# Patient Record
Sex: Male | Born: 1955 | Race: White | Hispanic: No | Marital: Married | Smoking: Current every day smoker
Health system: Southern US, Community
[De-identification: ages and names within clinical notes are randomized; demographics above are authoritative.]

## PROBLEM LIST (undated history)

## (undated) DIAGNOSIS — I214 Non-ST elevation (NSTEMI) myocardial infarction: Principal | ICD-10-CM

## (undated) DIAGNOSIS — Z8709 Personal history of other diseases of the respiratory system: Secondary | ICD-10-CM

## (undated) DIAGNOSIS — Z951 Presence of aortocoronary bypass graft: Secondary | ICD-10-CM

## (undated) DIAGNOSIS — K649 Unspecified hemorrhoids: Secondary | ICD-10-CM

## (undated) DIAGNOSIS — Q240 Dextrocardia: Secondary | ICD-10-CM

## (undated) DIAGNOSIS — I2510 Atherosclerotic heart disease of native coronary artery without angina pectoris: Secondary | ICD-10-CM

## (undated) DIAGNOSIS — Z87891 Personal history of nicotine dependence: Secondary | ICD-10-CM

## (undated) HISTORY — DX: Personal history of other diseases of the respiratory system: Z87.09

## (undated) HISTORY — PX: OTHER SURGICAL HISTORY: SHX169

## (undated) HISTORY — DX: Presence of aortocoronary bypass graft: Z95.1

## (undated) HISTORY — PX: LAMINECTOMY: SHX219

## (undated) HISTORY — DX: Atherosclerotic heart disease of native coronary artery without angina pectoris: I25.10

## (undated) HISTORY — DX: Personal history of nicotine dependence: Z87.891

## (undated) HISTORY — DX: Unspecified hemorrhoids: K64.9

## (undated) HISTORY — DX: Non-ST elevation (NSTEMI) myocardial infarction: I21.4

---

## 1975-12-31 HISTORY — PX: OTHER SURGICAL HISTORY: SHX169

## 2001-02-14 ENCOUNTER — Ambulatory Visit (HOSPITAL_COMMUNITY): Admission: RE | Admit: 2001-02-14 | Discharge: 2001-02-14 | Payer: Self-pay | Admitting: Orthopedic Surgery

## 2001-02-14 ENCOUNTER — Encounter: Payer: Self-pay | Admitting: Orthopedic Surgery

## 2003-04-14 ENCOUNTER — Emergency Department (HOSPITAL_COMMUNITY): Admission: EM | Admit: 2003-04-14 | Discharge: 2003-04-14 | Payer: Self-pay | Admitting: Emergency Medicine

## 2003-04-14 ENCOUNTER — Encounter: Payer: Self-pay | Admitting: Emergency Medicine

## 2003-04-21 ENCOUNTER — Encounter: Payer: Self-pay | Admitting: Internal Medicine

## 2003-04-21 ENCOUNTER — Encounter: Admission: RE | Admit: 2003-04-21 | Discharge: 2003-04-21 | Payer: Self-pay | Admitting: Internal Medicine

## 2003-04-21 ENCOUNTER — Ambulatory Visit (HOSPITAL_COMMUNITY): Admission: RE | Admit: 2003-04-21 | Discharge: 2003-04-21 | Payer: Self-pay | Admitting: Internal Medicine

## 2005-03-19 ENCOUNTER — Ambulatory Visit (HOSPITAL_BASED_OUTPATIENT_CLINIC_OR_DEPARTMENT_OTHER): Admission: RE | Admit: 2005-03-19 | Discharge: 2005-03-19 | Payer: Self-pay | Admitting: Orthopedic Surgery

## 2005-03-19 ENCOUNTER — Ambulatory Visit (HOSPITAL_COMMUNITY): Admission: RE | Admit: 2005-03-19 | Discharge: 2005-03-19 | Payer: Self-pay | Admitting: Orthopedic Surgery

## 2007-01-28 ENCOUNTER — Emergency Department (HOSPITAL_COMMUNITY): Admission: EM | Admit: 2007-01-28 | Discharge: 2007-01-28 | Payer: Self-pay | Admitting: Emergency Medicine

## 2011-01-10 ENCOUNTER — Emergency Department (HOSPITAL_COMMUNITY)
Admission: EM | Admit: 2011-01-10 | Discharge: 2011-01-10 | Payer: Self-pay | Source: Home / Self Care | Admitting: Family Medicine

## 2011-05-14 ENCOUNTER — Inpatient Hospital Stay (INDEPENDENT_AMBULATORY_CARE_PROVIDER_SITE_OTHER)
Admission: RE | Admit: 2011-05-14 | Discharge: 2011-05-14 | Disposition: A | Discharge status: Home / Self Care | Payer: 59 | Source: Ambulatory Visit | Attending: Family Medicine | Admitting: Family Medicine

## 2011-05-14 DIAGNOSIS — M799 Soft tissue disorder, unspecified: Secondary | ICD-10-CM

## 2011-05-17 NOTE — Op Note (Signed)
NAMELAVANCE, BEAZER             ACCOUNT NO.:  [id number]   MEDICAL RECORD NO.:  [id number]          PATIENT TYPE:  AMB   LOCATION:  DSC                          FACILITY:  [facility]   PHYSICIAN:  Katy Fitch. 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   POSTOPERATIVE DIAGNOSIS:  Chronic entrapment neuropathy right median nerve  at carpal tunnel.   OPERATION:  Release of right transcarpal ligament.   OPERATING SURGEON:  Katy Fitch. Sypher, M.D.   ASSISTANT:  Marveen Reeks. Dasnoit, P.A.C.   ANESTHESIA:  General by LMA.   SUPERVISING ANESTHESIOLOGIST:  Dr. Gypsy Balsam.   INDICATIONS:  Izaah Westman is a 55 year old gentleman referred for  evaluation and management of a painful and numb right hand. Clinical  examination revealed signs of probable clinical carpal tunnel syndrome. Dr.  Johna Roles performed detailed electrodiagnostic studies December 13, 2005,  documenting profound right median neuropathy with a motor latency of greater  than 13 and sensory latency of greater than six.  Due to failure to respond  to nonoperative measures, he is brought to the operating room at this time  for release of his right transcarpal ligament.   PROCEDURE:  Burwell Bethel is brought to the operating room and placed in  supine position on the operating table.   Following induction general anesthesia by LMA technique under direct  supervision of Dr. Gypsy Balsam, the right arm was prepped with Betadine soap and  solution and sterilely draped. A pneumatic tourniquet was applied to the  proximal brachium.   Following exsanguination of the limb, an arterial tourniquet on the proximal  brachium was inflated to 220 mmHg.   Procedure commenced with a short incision in the line of the ring finger and  the palm. Subcutaneous  tissues were carefully divided revealing the palmar  fascia. This was split longitudinally to reveal the common sensory branch of  the median nerve. These were followed back to transverse carpal ligament  which was gently isolated from the median nerve utilizing a Child psychotherapist.   The ligament was released along its ulnar border with scissors extending  into this distal forearm.   This widely opened carpal canal.   Mr. Eaves had minimum tenosynovium within the canal. He had simply a  confined canal.   No masses or other predicaments were appreciated.   The wound was then repaired with intradermal 3-0 Prolene suture. A  compressive dressing was applied with a volar plaster splint maintaining the  wrist in 5 degrees dorsiflexion.      RVS/MEDQ  D:  03/19/2005  T:  03/19/2005  Job:  [id number]

## 2012-01-03 ENCOUNTER — Ambulatory Visit (INDEPENDENT_AMBULATORY_CARE_PROVIDER_SITE_OTHER): Payer: 59

## 2012-01-03 DIAGNOSIS — R05 Cough: Secondary | ICD-10-CM

## 2012-01-23 ENCOUNTER — Ambulatory Visit (INDEPENDENT_AMBULATORY_CARE_PROVIDER_SITE_OTHER): Payer: 59

## 2012-01-23 DIAGNOSIS — R05 Cough: Secondary | ICD-10-CM

## 2012-01-23 DIAGNOSIS — J45909 Unspecified asthma, uncomplicated: Secondary | ICD-10-CM

## 2012-02-08 ENCOUNTER — Other Ambulatory Visit: Payer: Self-pay | Admitting: Physician Assistant

## 2012-02-11 ENCOUNTER — Other Ambulatory Visit: Payer: Self-pay | Admitting: Physician Assistant

## 2012-02-17 ENCOUNTER — Other Ambulatory Visit: Payer: Self-pay | Admitting: Physician Assistant

## 2012-03-19 ENCOUNTER — Other Ambulatory Visit: Payer: Self-pay | Admitting: Physician Assistant

## 2012-03-21 ENCOUNTER — Other Ambulatory Visit: Payer: Self-pay | Admitting: Physician Assistant

## 2012-03-27 ENCOUNTER — Ambulatory Visit (INDEPENDENT_AMBULATORY_CARE_PROVIDER_SITE_OTHER): Payer: 59 | Admitting: Internal Medicine

## 2012-03-27 VITALS — BP 136/91 | HR 88 | Temp 97.9°F | Resp 16 | Ht 71.0 in | Wt 215.0 lb

## 2012-03-27 DIAGNOSIS — J019 Acute sinusitis, unspecified: Secondary | ICD-10-CM

## 2012-03-27 DIAGNOSIS — G4700 Insomnia, unspecified: Secondary | ICD-10-CM | POA: Insufficient documentation

## 2012-03-27 DIAGNOSIS — J209 Acute bronchitis, unspecified: Secondary | ICD-10-CM

## 2012-03-27 DIAGNOSIS — F17200 Nicotine dependence, unspecified, uncomplicated: Secondary | ICD-10-CM

## 2012-03-27 MED ORDER — ZOLPIDEM TARTRATE 5 MG PO TABS: 5.0000 mg | ORAL_TABLET | ORAL | Status: DC | PRN

## 2012-03-27 MED ORDER — BECLOMETHASONE DIPROPIONATE 80 MCG/ACT IN AERS: 1.0000 | INHALATION_SPRAY | RESPIRATORY_TRACT | Status: DC | PRN

## 2012-03-27 MED ORDER — VARENICLINE TARTRATE 0.5 MG PO TABS: 0.5000 mg | ORAL_TABLET | Freq: Two times a day (BID) | ORAL | Status: AC

## 2012-03-27 MED ORDER — AZITHROMYCIN 250 MG PO TABS: ORAL_TABLET | ORAL | Status: AC

## 2012-03-27 NOTE — Progress Notes (Signed)
  Subjective:    Patient ID: Jason Roberts, male    DOB: [birth year], 56 y.o.   MRN: [id number]  Sinus Problem This is a new problem. The current episode started 1 to 4 weeks ago. The problem is unchanged. There has been no fever. Associated symptoms include congestion, coughing and a hoarse voice. Pertinent negatives include no shortness of breath.  Jason Roberts is here for a history of three weeks of increased sinus and chest congestion.  He continues to smoke.  No fever or chest pain.  He does not feel as though he is wheezing but states the inhaler given last time seemed to help a lot along with the prednisone.  He is working okay.  Is considering switching to an electric cigarette.  Knows he needs to quit smoking    Review of Systems  HENT: Positive for congestion and hoarse voice.   Respiratory: Positive for cough. Negative for chest tightness, shortness of breath and wheezing.   All other systems reviewed and are negative.       Objective:   Physical Exam  Vitals reviewed. Constitutional: He is oriented to person, place, and time. He appears well-developed and well-nourished.  HENT:  Head: Normocephalic.  Right Ear: External ear normal.  Left Ear: External ear normal.  Mouth/Throat: No oropharyngeal exudate.  Neck: Neck supple.  Pulmonary/Chest: Effort normal.       Rare wheeze heart in left front chest wall.  Abdominal: Soft.  Lymphadenopathy:    He has no cervical adenopathy.  Neurological: He is alert and oriented to person, place, and time.  Skin: Skin is warm and dry.  Psychiatric: He has a normal mood and affect. His behavior is normal.          Assessment & Plan:  Bronchitis/Sinusitis:  Zpack, Qvar for cough.   Nicotine Addiction:  RF CHantix prescription in case he wishes to try smoking cessation with Chantix as he has no risk factors.  Emphasized the need to work on stopping for good! Insomnia:  Ambien refilled but not sent to pharmacy so we refilled today  with 2 refills per Jason Roberts.

## 2012-03-27 NOTE — Patient Instructions (Signed)
Take your antibiotic and use your inhaler for your cough and sinus congestion.  You should improve in 5 days, return sooner if your symptoms worsen.  Chronic Obstructive Pulmonary Disease Exacerbation Chronic obstructive pulmonary disease (COPD) is a condition that limits airflow. COPD may include chronic bronchitis, pulmonary emphysema, or both. A COPD exacerbation means that your COPD has gotten worse. Without treatment, this can be a life-threatening problem. COPD exacerbation requires immediate medical care. CAUSES  COPD exacerbation can be caused by:  Exposure to smoke.   Exposure to air pollution, chemical fumes, or dust.   Respiratory infections.   Genetics, particularly alpha 1-antitrypsin deficiency.   A condition in which the body's immune system attacks itself (autoimmunity).  SYMPTOMS   Increased coughing.   Increased wheezing.   Increased shortness of breath.   Swelling due to a buildup of fluid (peripheral edema) related to heart strain.   Rapid breathing.   Chest enlargement (barrel chest).   Chest tightness.  DIAGNOSIS  There is no single test that can diagnosis COPD exacerbation. Your history, physical exam, and other tests will help your caregiver make a diagnosis. Tests may include a chest X-ray, pulmonary function tests, spirometry, basic lab tests, and an arterial blood gas test. TREATMENT  Severe problems may require a stay in the hospital. Depending on the cause of your problems, the following may be prescribed:  Antibiotic medicines.   Bronchodilators (inhaled or tablets).   Cortisone medicines (inhaled or tablets).   Supplemental oxygen therapy.   Pulmonary rehabilitation. This is a broad program that may involve exercise, nutrition counseling, breathing techniques, and further education about your condition.  It is important to use good technique with inhaled medicines. Spacer devices may be needed to help improve drug delivery. HOME CARE  INSTRUCTIONS   Do not smoke. Quitting smoking is very important to prevent worsening of COPD.   Avoid exposure to all substances that irritate the airway, especially tobacco smoke.   If prescribed, take your antibiotics as directed. Finish them even if you start to feel better.   Only take over-the-counter or prescription medicines as directed by your caregiver.   Drink enough fluids to keep your urine clear or pale yellow. This can help thin bronchial secretions.   Use a cool mist vaporizer. This makes it easier to clear your chest when you cough.   If you have a home nebulizer and oxygen, continue to use them as directed.   Maintain all necessary vaccinations to prevent infections.   Exercise regularly.   Eat a healthy diet.   Keep all follow-up appointments as directed by your caregiver.  SEEK IMMEDIATE MEDICAL CARE IF:  You have extreme shortness of breath.   You have severe chest pain or blood in your sputum.   You have a high fever, weakness, repeated vomiting, or fainting.   You feel confused.  MAKE SURE YOU:   Understand these instructions.   Will watch your condition.   Will get help right away if you are not doing well or get worse.  Document Released: 10/13/2007 Document Revised: 12/05/2011 Document Reviewed: 08/13/2011 [facility] Patient Information 2012 [place], [place].

## 2012-05-29 ENCOUNTER — Telehealth: Payer: Self-pay

## 2012-05-29 NOTE — Telephone Encounter (Signed)
Pt wife calling stating that the ppw that pt needs to be filled out for work is for fmla because he has hemmorriods and the bleed a lot and he has to leave work and come home,. And she also would like referral for specialist [phone].

## 2012-05-30 DIAGNOSIS — Z0271 Encounter for disability determination: Secondary | ICD-10-CM

## 2012-06-24 ENCOUNTER — Other Ambulatory Visit: Payer: Self-pay | Admitting: Physician Assistant

## 2012-06-27 ENCOUNTER — Ambulatory Visit (INDEPENDENT_AMBULATORY_CARE_PROVIDER_SITE_OTHER): Payer: 59 | Admitting: Family Medicine

## 2012-06-27 VITALS — BP 129/85 | HR 76 | Temp 98.0°F | Resp 18 | Ht 70.0 in | Wt 215.0 lb

## 2012-06-27 DIAGNOSIS — M1712 Unilateral primary osteoarthritis, left knee: Secondary | ICD-10-CM

## 2012-06-27 DIAGNOSIS — K602 Anal fissure, unspecified: Secondary | ICD-10-CM

## 2012-06-27 DIAGNOSIS — M1710 Unilateral primary osteoarthritis, unspecified knee: Secondary | ICD-10-CM

## 2012-06-27 DIAGNOSIS — J329 Chronic sinusitis, unspecified: Secondary | ICD-10-CM

## 2012-06-27 MED ORDER — HYDROCODONE-ACETAMINOPHEN 5-500 MG PO TABS: 1.0000 | ORAL_TABLET | Freq: Three times a day (TID) | ORAL | Status: AC | PRN

## 2012-06-27 MED ORDER — HYDROCORTISONE 2.5 % RE CREA: TOPICAL_CREAM | Freq: Two times a day (BID) | RECTAL | Status: AC

## 2012-06-27 MED ORDER — PREDNISONE 20 MG PO TABS: ORAL_TABLET | ORAL | Status: DC

## 2012-06-27 MED ORDER — LEVOFLOXACIN 500 MG PO TABS: 500.0000 mg | ORAL_TABLET | Freq: Every day | ORAL | Status: AC

## 2012-06-27 NOTE — Progress Notes (Signed)
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  10 years ago he "blew out" the left knee.  Intermittently he has problems with the knee, which again flared this week.  Also, he complains about sinus congestion.  This is a recurrent problem and this has been fluctuating for a month.  Has had intermittent epistaxis.  Smokes  Also notes painful anus with some bleeding.  Had colonoscopy 2012  Objective:  Clearly favoring the right leg HEENT:  White patches in nasal passages with mucopurulent d/c Chest:  Left thoracotomy scar Left knee:  Nontender, slow ROM with no lig laxity Rectal:  Small fissure, skin tags   Assessment:   1. Sinusitis with white patches 2. Knee arthritis 3. Anal fissure  Plan: 1. Sinusitis  levofloxacin (LEVAQUIN) 500 MG tablet, Ambulatory referral to ENT  2. Arthritis of knee, left  predniSONE (DELTASONE) 20 MG tablet, HYDROcodone-acetaminophen (VICODIN) 5-500 MG per tablet  3. Anal fissure  hydrocortisone (ANUSOL-HC) 2.5 % rectal cream   FMLA papers filled Encouraged to quit smoking

## 2012-08-06 ENCOUNTER — Telehealth: Payer: Self-pay

## 2012-08-06 MED ORDER — ZOLPIDEM TARTRATE 5 MG PO TABS: 5.0000 mg | ORAL_TABLET | Freq: Every evening | ORAL | Status: DC | PRN

## 2012-08-06 NOTE — Telephone Encounter (Signed)
Spoke with wife and they need refill on Ambien. Please call [phone]

## 2012-08-06 NOTE — Telephone Encounter (Signed)
Pt wife notified that her and husbands rx was sent into pharmacy

## 2012-08-06 NOTE — Telephone Encounter (Signed)
At T[facility] desk 

## 2012-09-08 ENCOUNTER — Other Ambulatory Visit: Payer: Self-pay | Admitting: Physician Assistant

## 2012-09-09 ENCOUNTER — Other Ambulatory Visit: Payer: Self-pay | Admitting: *Deleted

## 2012-09-10 ENCOUNTER — Other Ambulatory Visit: Payer: Self-pay | Admitting: Physician Assistant

## 2012-09-15 ENCOUNTER — Other Ambulatory Visit: Payer: Self-pay | Admitting: Physician Assistant

## 2013-02-01 ENCOUNTER — Encounter (INDEPENDENT_AMBULATORY_CARE_PROVIDER_SITE_OTHER): Payer: Self-pay | Admitting: General Surgery

## 2013-02-04 ENCOUNTER — Ambulatory Visit (INDEPENDENT_AMBULATORY_CARE_PROVIDER_SITE_OTHER): Payer: 59 | Admitting: General Surgery

## 2013-02-11 ENCOUNTER — Ambulatory Visit (INDEPENDENT_AMBULATORY_CARE_PROVIDER_SITE_OTHER): Payer: 59 | Admitting: General Surgery

## 2013-02-22 ENCOUNTER — Other Ambulatory Visit: Payer: Self-pay | Admitting: Family Medicine

## 2013-02-22 ENCOUNTER — Ambulatory Visit
Admission: RE | Admit: 2013-02-22 | Discharge: 2013-02-22 | Disposition: A | Discharge status: Home / Self Care | Payer: 59 | Source: Ambulatory Visit | Attending: Family Medicine | Admitting: Family Medicine

## 2013-02-22 DIAGNOSIS — R05 Cough: Secondary | ICD-10-CM

## 2013-02-25 ENCOUNTER — Encounter (INDEPENDENT_AMBULATORY_CARE_PROVIDER_SITE_OTHER): Payer: Self-pay | Admitting: General Surgery

## 2013-02-25 ENCOUNTER — Ambulatory Visit (INDEPENDENT_AMBULATORY_CARE_PROVIDER_SITE_OTHER): Payer: 59 | Admitting: General Surgery

## 2013-02-25 VITALS — BP 124/83 | HR 72 | Temp 98.6°F | Resp 16 | Ht 70.0 in | Wt 231.8 lb

## 2013-02-25 DIAGNOSIS — K649 Unspecified hemorrhoids: Secondary | ICD-10-CM

## 2013-02-25 MED ORDER — HYDROCORTISONE ACETATE 25 MG RE SUPP: 25.0000 mg | Freq: Two times a day (BID) | RECTAL | Status: DC

## 2013-02-25 NOTE — Progress Notes (Signed)
Subjective:     Patient ID: Jason Roberts, male   DOB: Aug 18, 1956, 57 y.o.   MRN: [id number]  HPI The patient is a 57 year old male Referred by Dr. Blair Heys  And states he's had a long history of hemorrhoids. The patient presents today with chief complaint of bleeding while at work. The patient works as Pension scheme manager and states that after having a lift help him to the frequency of bleeding has decreased.  The patient states he is a regular bowel movement in the a.m. With some bleeding at that time.  Review of Systems  Constitutional: Negative.   HENT: Negative.   Eyes: Negative.   Respiratory: Negative.   Cardiovascular: Negative.   Gastrointestinal: Negative.   Endocrine: Negative.   Neurological: Negative.        Objective:   Physical Exam  Constitutional: He is oriented to person, place, and time. He appears well-developed and well-nourished.  HENT:  Head: Normocephalic and atraumatic.  Eyes: EOM are normal. Pupils are equal, round, and reactive to light.  Neck: Normal range of motion. Neck supple.  Cardiovascular: Normal rate and normal heart sounds.   Pulmonary/Chest: Effort normal and breath sounds normal.  Abdominal: Bowel sounds are normal.  Genitourinary:     Musculoskeletal: Normal range of motion.  Neurological: He is alert and oriented to person, place, and time.    Anoscopy: small internal hemorrhoids and external hemorrhoids, nonthrombosed     Assessment:     57 year old male with external and internal hemorrhoids     Plan:     We discussed and the patient having regular bowel regimen to ensure male had bowel movements easily and smoothly without straining. We discussed that the need for assistance at work to help prevent any strain to decrease  The frequency of bleeding would help. 1. Anusol suppositories 2. Patient follow up in 4-6 weeks to reevaluate the situation. At that time the patient like to proceed with surgical intervention  we'll set him up for that.

## 2013-03-20 ENCOUNTER — Encounter (HOSPITAL_BASED_OUTPATIENT_CLINIC_OR_DEPARTMENT_OTHER): Payer: Self-pay | Admitting: *Deleted

## 2013-03-20 ENCOUNTER — Emergency Department (HOSPITAL_BASED_OUTPATIENT_CLINIC_OR_DEPARTMENT_OTHER)
Admission: EM | Admit: 2013-03-20 | Discharge: 2013-03-20 | Disposition: A | Discharge status: Home / Self Care | Payer: 59 | Attending: Emergency Medicine | Admitting: Emergency Medicine

## 2013-03-20 DIAGNOSIS — J329 Chronic sinusitis, unspecified: Secondary | ICD-10-CM

## 2013-03-20 DIAGNOSIS — Z79899 Other long term (current) drug therapy: Secondary | ICD-10-CM | POA: Insufficient documentation

## 2013-03-20 DIAGNOSIS — F17200 Nicotine dependence, unspecified, uncomplicated: Secondary | ICD-10-CM | POA: Insufficient documentation

## 2013-03-20 DIAGNOSIS — R51 Headache: Secondary | ICD-10-CM | POA: Insufficient documentation

## 2013-03-20 DIAGNOSIS — R040 Epistaxis: Secondary | ICD-10-CM | POA: Insufficient documentation

## 2013-03-20 DIAGNOSIS — Z8679 Personal history of other diseases of the circulatory system: Secondary | ICD-10-CM | POA: Insufficient documentation

## 2013-03-20 DIAGNOSIS — IMO0002 Reserved for concepts with insufficient information to code with codable children: Secondary | ICD-10-CM | POA: Insufficient documentation

## 2013-03-20 DIAGNOSIS — J3489 Other specified disorders of nose and nasal sinuses: Secondary | ICD-10-CM | POA: Insufficient documentation

## 2013-03-20 MED ORDER — OXYMETAZOLINE HCL 0.05 % NA SOLN: 2.0000 | Freq: Once | NASAL | Status: DC

## 2013-03-20 MED ORDER — OXYMETAZOLINE HCL 0.05 % NA SOLN
1.0000 | Freq: Once | NASAL | Status: AC
  Administered 2013-03-20: 1 via NASAL
  Filled 2013-03-20: qty 15

## 2013-03-20 MED ORDER — CEFDINIR 300 MG PO CAPS: 300.0000 mg | ORAL_CAPSULE | Freq: Two times a day (BID) | ORAL | Status: DC

## 2013-03-20 MED ORDER — IBUPROFEN 800 MG PO TABS
800.0000 mg | ORAL_TABLET | Freq: Once | ORAL | Status: AC
  Administered 2013-03-20: 800 mg via ORAL
  Filled 2013-03-20: qty 1

## 2013-03-20 NOTE — ED Provider Notes (Signed)
History     CSN: [id number]  Arrival date & time 03/20/13  4098   First MD Initiated Contact with Patient 03/20/13 1946      Chief Complaint  Patient presents with  . Epistaxis    (Consider location/radiation/quality/duration/timing/severity/associated sxs/prior treatment) HPI Comments: Pt presents to the ED for intermittent epistaxis over the past 3 days.  States it was initially only out of the left nostril but past few episodes have been from both nostrils.  Blood pours out profusely but hemostasis has been achieved with direct pressure each time.  Pt is not currently on any blood thinners.  Also complaining of sinus congestion, pressure, and headache.  Prior sinus infections with similar sx.  Denies any chest pain, SOB, nausea, vomiting, dizziness, or lightheadedness.  Patient is a 57 y.o. male presenting with nosebleeds. The history is provided by the patient.  Epistaxis     Past Medical History  Diagnosis Date  . Hemorrhoids     Past Surgical History  Procedure Laterality Date  . Lll lung  1977    due to empyema  . Laminectomy    . Left arm chain saw injury      Family History  Problem Relation Age of Onset  . Diabetes Mother   . Hypertension Mother   . Stroke Father     History  Substance Use Topics  . Smoking status: Current Every Day Smoker -- 1.00 packs/day    Types: Cigarettes  . Smokeless tobacco: Not on file  . Alcohol Use: No      Review of Systems  HENT: Positive for nosebleeds and sinus pressure.   All other systems reviewed and are negative.    Allergies  Penicillins  Home Medications   Current Outpatient Rx  Name  Route  Sig  Dispense  Refill  . beclomethasone (QVAR) 80 MCG/ACT inhaler   Inhalation   Inhale 1 puff into the lungs as needed.   1 Inhaler   2   . doxycycline (VIBRAMYCIN) 100 MG capsule               . HYDROcodone-homatropine (HYCODAN) 5-1.5 MG/5ML syrup               . hydrocortisone (ANUSOL-HC) 25 MG  suppository   Rectal   Place 1 suppository (25 mg total) rectally 2 (two) times daily.   12 suppository   0   . levothyroxine (SYNTHROID, LEVOTHROID) 50 MCG tablet               . predniSONE (DELTASONE) 20 MG tablet      3-2-2-1-1-1 daily with food   13 tablet   0   . traZODone (DESYREL) 50 MG tablet   Oral   Take 50 mg by mouth at bedtime.         Marland Kitchen zolpidem (AMBIEN) 5 MG tablet      TAKE ONE TABLET BY MOUTH AT BEDTIME AS NEEDED FOR SLEEP   30 tablet   0     BP 130/99  Pulse 81  Temp(Src) 98.4 F (36.9 C) (Oral)  Resp 18  Ht 5\' 10"  (1.778 m)  Wt 215 lb (97.523 kg)  BMI 30.85 kg/m2  SpO2 99%  Physical Exam  Nursing note and vitals reviewed. Constitutional: He is oriented to person, place, and time. He appears well-developed and well-nourished.  HENT:  Head: Normocephalic and atraumatic.  Right Ear: Ear canal normal. A middle ear effusion is present.  Left Ear: Ear canal normal. A  middle ear effusion is present.  Nose: Epistaxis (both nostrils) is observed. Right sinus exhibits maxillary sinus tenderness and frontal sinus tenderness. Left sinus exhibits maxillary sinus tenderness and frontal sinus tenderness.  Mouth/Throat: Uvula is midline, oropharynx is clear and moist and mucous membranes are normal. No oropharyngeal exudate, posterior oropharyngeal edema, posterior oropharyngeal erythema or tonsillar abscesses.  No blood noted in posterior pharynx  Eyes: Conjunctivae and EOM are normal. Pupils are equal, round, and reactive to light.  Neck: Normal range of motion. Neck supple.  Cardiovascular: Normal rate, regular rhythm and normal heart sounds.   Pulmonary/Chest: Effort normal and breath sounds normal. He has no wheezes.  Abdominal: Soft. Bowel sounds are normal.  Lymphadenopathy:    He has no cervical adenopathy.  Neurological: He is alert and oriented to person, place, and time.  Skin: Skin is warm and dry.  Psychiatric: He has a normal mood and  affect.    ED Course  Procedures (including critical care time)  Labs Reviewed - No data to display No results found.   1. Epistaxis   2. Sinus infection       MDM   Intermittent epistaxis x 3 days.  Bleeding site visualized on exam.  Bleeding well controlled in the ED following pt blowing his nose fully and afrin.  Pt feels like he is getting a sinus infection- admits to sinus pressure with headache.  Will tx with omnicef x 10days.  Instructed on afrin use at home should nosebleed recur.  FU with PCP within the next week for recheck or sooner if sx are not resolving.  Return precautions advised.        Garlon Hatchet, PA-C 03/21/13 1700

## 2013-03-20 NOTE — ED Notes (Addendum)
Pt states he has had nosebleeds off and on x 3 days. Some dizziness earlier today. States he feels like he has a sinus infection. No bleeding at present.

## 2013-03-22 ENCOUNTER — Encounter (INDEPENDENT_AMBULATORY_CARE_PROVIDER_SITE_OTHER): Payer: Self-pay

## 2013-03-22 NOTE — ED Provider Notes (Signed)
Medical screening examination/treatment/procedure(s) were performed by non-physician practitioner and as supervising physician I was immediately available for consultation/collaboration.  Jeven Topper, MD 03/22/13 0830 

## 2013-04-08 ENCOUNTER — Encounter (INDEPENDENT_AMBULATORY_CARE_PROVIDER_SITE_OTHER): Payer: 59 | Admitting: General Surgery

## 2013-12-26 ENCOUNTER — Encounter (HOSPITAL_COMMUNITY): Payer: Self-pay | Admitting: Emergency Medicine

## 2013-12-26 ENCOUNTER — Inpatient Hospital Stay (HOSPITAL_COMMUNITY)
Admission: EM | Admit: 2013-12-26 | Discharge: 2014-01-03 | DRG: 234 | Disposition: A | Discharge status: Home / Self Care | Payer: PRIVATE HEALTH INSURANCE | Attending: Surgery | Admitting: Surgery

## 2013-12-26 ENCOUNTER — Inpatient Hospital Stay (HOSPITAL_COMMUNITY): Payer: PRIVATE HEALTH INSURANCE

## 2013-12-26 DIAGNOSIS — Z720 Tobacco use: Secondary | ICD-10-CM | POA: Diagnosis present

## 2013-12-26 DIAGNOSIS — I2510 Atherosclerotic heart disease of native coronary artery without angina pectoris: Secondary | ICD-10-CM

## 2013-12-26 DIAGNOSIS — D62 Acute posthemorrhagic anemia: Secondary | ICD-10-CM | POA: Diagnosis not present

## 2013-12-26 DIAGNOSIS — E8779 Other fluid overload: Secondary | ICD-10-CM | POA: Diagnosis not present

## 2013-12-26 DIAGNOSIS — I4949 Other premature depolarization: Secondary | ICD-10-CM | POA: Diagnosis not present

## 2013-12-26 DIAGNOSIS — I214 Non-ST elevation (NSTEMI) myocardial infarction: Secondary | ICD-10-CM

## 2013-12-26 DIAGNOSIS — G4700 Insomnia, unspecified: Secondary | ICD-10-CM | POA: Diagnosis present

## 2013-12-26 DIAGNOSIS — R7309 Other abnormal glucose: Secondary | ICD-10-CM | POA: Diagnosis present

## 2013-12-26 DIAGNOSIS — F17200 Nicotine dependence, unspecified, uncomplicated: Secondary | ICD-10-CM | POA: Diagnosis present

## 2013-12-26 DIAGNOSIS — D696 Thrombocytopenia, unspecified: Secondary | ICD-10-CM | POA: Diagnosis not present

## 2013-12-26 DIAGNOSIS — Z79899 Other long term (current) drug therapy: Secondary | ICD-10-CM

## 2013-12-26 DIAGNOSIS — Q240 Dextrocardia: Secondary | ICD-10-CM

## 2013-12-26 DIAGNOSIS — K5900 Constipation, unspecified: Secondary | ICD-10-CM | POA: Diagnosis not present

## 2013-12-26 DIAGNOSIS — E785 Hyperlipidemia, unspecified: Secondary | ICD-10-CM | POA: Diagnosis present

## 2013-12-26 DIAGNOSIS — Z951 Presence of aortocoronary bypass graft: Secondary | ICD-10-CM

## 2013-12-26 DIAGNOSIS — K649 Unspecified hemorrhoids: Secondary | ICD-10-CM | POA: Diagnosis present

## 2013-12-26 DIAGNOSIS — Q248 Other specified congenital malformations of heart: Secondary | ICD-10-CM

## 2013-12-26 HISTORY — DX: Dextrocardia: Q24.0

## 2013-12-26 HISTORY — DX: Non-ST elevation (NSTEMI) myocardial infarction: I21.4

## 2013-12-26 HISTORY — DX: Atherosclerotic heart disease of native coronary artery without angina pectoris: I25.10

## 2013-12-26 LAB — CBC
HCT: 45.6 % (ref 39.0–52.0)
Hemoglobin: 15.9 g/dL (ref 13.0–17.0)
MCH: 33.8 pg (ref 26.0–34.0)
MCHC: 34.9 g/dL (ref 30.0–36.0)
Platelets: 218 10*3/uL (ref 150–400)
RDW: 12.9 % (ref 11.5–15.5)
WBC: 11 10*3/uL — ABNORMAL HIGH (ref 4.0–10.5)

## 2013-12-26 LAB — DIFFERENTIAL
Basophils Absolute: 0 10*3/uL (ref 0.0–0.1)
Basophils Relative: 0 % (ref 0–1)
Monocytes Absolute: 1 10*3/uL (ref 0.1–1.0)
Neutro Abs: 6.6 10*3/uL (ref 1.7–7.7)
Neutrophils Relative %: 60 % (ref 43–77)

## 2013-12-26 LAB — MAGNESIUM: Magnesium: 1.9 mg/dL (ref 1.5–2.5)

## 2013-12-26 LAB — BASIC METABOLIC PANEL
CO2: 28 mEq/L (ref 19–32)
Calcium: 9.3 mg/dL (ref 8.4–10.5)
Creatinine, Ser: 1.04 mg/dL (ref 0.50–1.35)
GFR calc Af Amer: 90 mL/min — ABNORMAL LOW (ref >90)
Glucose, Bld: 108 mg/dL — ABNORMAL HIGH (ref 70–99)

## 2013-12-26 LAB — PRO B NATRIURETIC PEPTIDE: Pro B Natriuretic peptide (BNP): 774.3 pg/mL — ABNORMAL HIGH (ref 0–125)

## 2013-12-26 LAB — TROPONIN I: Troponin I: 10.37 ng/mL (ref <0.30)

## 2013-12-26 LAB — PROTIME-INR: INR: 0.97 (ref 0.00–1.49)

## 2013-12-26 LAB — POCT I-STAT TROPONIN I: Troponin i, poc: 1.9 ng/mL (ref 0.00–0.08)

## 2013-12-26 LAB — APTT: aPTT: 34 seconds (ref 24–37)

## 2013-12-26 MED ORDER — ACETAMINOPHEN 325 MG PO TABS
650.0000 mg | ORAL_TABLET | ORAL | Status: DC | PRN
  Administered 2013-12-27: 650 mg via ORAL
  Filled 2013-12-26: qty 2

## 2013-12-26 MED ORDER — ATORVASTATIN CALCIUM 80 MG PO TABS
80.0000 mg | ORAL_TABLET | Freq: Every day | ORAL | Status: DC
  Administered 2013-12-26 – 2013-12-28 (×3): 80 mg via ORAL
  Filled 2013-12-26 (×5): qty 1

## 2013-12-26 MED ORDER — METOPROLOL TARTRATE 12.5 MG HALF TABLET
12.5000 mg | ORAL_TABLET | Freq: Two times a day (BID) | ORAL | Status: DC
  Administered 2013-12-26 – 2013-12-28 (×5): 12.5 mg via ORAL
  Filled 2013-12-26 (×7): qty 1

## 2013-12-26 MED ORDER — ZOLPIDEM TARTRATE 5 MG PO TABS
5.0000 mg | ORAL_TABLET | Freq: Every evening | ORAL | Status: DC | PRN
  Administered 2013-12-27 (×2): 5 mg via ORAL
  Filled 2013-12-26 (×3): qty 1

## 2013-12-26 MED ORDER — HEPARIN (PORCINE) IN NACL 100-0.45 UNIT/ML-% IJ SOLN
1250.0000 [IU]/h | INTRAMUSCULAR | Status: DC
  Administered 2013-12-26 (×2): 1250 [IU]/h via INTRAVENOUS
  Filled 2013-12-26: qty 250

## 2013-12-26 MED ORDER — NICOTINE 21 MG/24HR TD PT24
21.0000 mg | MEDICATED_PATCH | Freq: Every day | TRANSDERMAL | Status: DC
  Administered 2013-12-26 – 2013-12-28 (×3): 21 mg via TRANSDERMAL
  Filled 2013-12-26 (×4): qty 1

## 2013-12-26 MED ORDER — NITROGLYCERIN 0.4 MG SL SUBL: 0.4000 mg | SUBLINGUAL_TABLET | SUBLINGUAL | Status: DC | PRN

## 2013-12-26 MED ORDER — ONDANSETRON HCL 4 MG/2ML IJ SOLN: 4.0000 mg | Freq: Four times a day (QID) | INTRAMUSCULAR | Status: DC | PRN

## 2013-12-26 MED ORDER — ASPIRIN EC 81 MG PO TBEC
81.0000 mg | DELAYED_RELEASE_TABLET | Freq: Every day | ORAL | Status: DC
  Filled 2013-12-26: qty 1

## 2013-12-26 MED ORDER — NICOTINE 21 MG/24HR TD PT24: 21.0000 mg | MEDICATED_PATCH | Freq: Every day | TRANSDERMAL | Status: DC

## 2013-12-26 MED ORDER — HEPARIN BOLUS VIA INFUSION
4000.0000 [IU] | Freq: Once | INTRAVENOUS | Status: AC
  Administered 2013-12-26: 4000 [IU] via INTRAVENOUS
  Filled 2013-12-26: qty 4000

## 2013-12-26 NOTE — ED Notes (Signed)
rec'd report.  Pt laying in bed, skin pink, wm, dry.  Pt states pain "a half point" on 1/10 scale, pointing to right center chest.  Awaiting room assignment on stepdown and heparin to come from pharmacy.  Pt is aware.

## 2013-12-26 NOTE — Progress Notes (Signed)
ANTICOAGULATION CONSULT NOTE - Initial Consult  Pharmacy Consult for Heparin Indication: chest pain/ACS  Allergies  Allergen Reactions  . Penicillins Swelling    Patient Measurements:   Wt Readings from Last 3 Encounters:  03/20/13 215 lb (97.523 kg)  02/25/13 231 lb 12.8 oz (105.144 kg)  06/27/12 215 lb (97.523 kg)   Heparin Dosing Weight: ~90 kg  Vital Signs: BP: 135/70 mmHg (12/28 1830) Pulse Rate: 82 (12/28 1830)  Labs:  Recent Labs  12/26/13 1830  HGB 15.9  HCT 45.6  PLT 218    CrCl is unknown because no creatinine reading has been taken.   Medical History: Past Medical History  Diagnosis Date  . Hemorrhoids     Medications:  Infusions:   Assessment: 57 year old male presenting with chest pain to begin anticoagulation with Heparin.  No anticoagulants noted in his history.  Goal of Therapy:  Heparin level 0.3-0.7 units/ml Monitor platelets by anticoagulation protocol: Yes   Plan:  Heparin 4000 units IV bolus x 1 Start Heparin drip at 1250 units/hr 6hr heparin level Daily heparin level and CBC Follow-up baseline PT, PTT (in process)  Estella Husk, Pharm.D., BCPS, AAHIVP Clinical Pharmacist Phone: [phone] or [phone] 12/26/2013, 6:59 PM

## 2013-12-26 NOTE — ED Notes (Signed)
Dr.Wickline and Cardiologist at bedside shown Istat Tropin. Results. ED-Lab.

## 2013-12-26 NOTE — ED Notes (Signed)
Called into pt room; pt experienced a few seconds of right chest pain "at one point" with cough. Monitor showed short run tachycardia that lasted no more than 5 seconds.

## 2013-12-26 NOTE — ED Provider Notes (Signed)
CSN: [id number]     Arrival date & time 12/26/13  1823 History   First MD Initiated Contact with Patient 12/26/13 1836     Chief Complaint  Patient presents with  . Chest Pain   Patient is a 57 y.o. male presenting with chest pain.  Chest Pain Pain location:  Substernal area Pain quality: aching and pressure   Pain radiates to:  Does not radiate Pain radiates to the back: no   Pain severity:  Moderate Onset quality:  Sudden Timing:  Intermittent Progression:  Resolved Chronicity:  New Relieved by:  Rest, aspirin and nitroglycerin Associated symptoms: no abdominal pain, no cough, no fever, no nausea, no shortness of breath and not vomiting     Past Medical History  Diagnosis Date  . Hemorrhoids   . Dextrocardia    Past Surgical History  Procedure Laterality Date  . Lll lung  1977    due to empyema  . Laminectomy    . Left arm chain saw injury     Family History  Problem Relation Age of Onset  . Diabetes Mother   . Hypertension Mother   . Stroke Father    History  Substance Use Topics  . Smoking status: Current Every Day Smoker -- 1.00 packs/day    Types: Cigarettes  . Smokeless tobacco: Not on file  . Alcohol Use: No    Review of Systems  Constitutional: Negative for fever and chills.  Respiratory: Negative for cough and shortness of breath.   Cardiovascular: Positive for chest pain.  Gastrointestinal: Negative for nausea, vomiting and abdominal pain.  All other systems reviewed and are negative.    Allergies  Penicillins  Home Medications   No current outpatient prescriptions on file. BP 131/75  Pulse 65  Temp(Src) 97.9 F (36.6 C) (Axillary)  Resp 20  Ht 5\' 11"  (1.803 m)  Wt 201 lb 4.5 oz (91.3 kg)  BMI 28.09 kg/m2  SpO2 95% Physical Exam  Constitutional: He is oriented to person, place, and time. He appears well-developed and well-nourished. No distress.  HENT:  Head: Normocephalic and atraumatic.  Mouth/Throat: No oropharyngeal exudate.   Eyes: Conjunctivae are normal. Pupils are equal, round, and reactive to light.  Neck: Normal range of motion. Neck supple.  Cardiovascular: Normal rate and normal heart sounds.  Exam reveals no gallop and no friction rub.   No murmur heard. Pulmonary/Chest: Effort normal and breath sounds normal.  Abdominal: Soft. He exhibits no distension. There is no tenderness.  Musculoskeletal: Normal range of motion. He exhibits no edema and no tenderness.  Neurological: He is alert and oriented to person, place, and time. He has normal strength and normal reflexes. No cranial nerve deficit or sensory deficit. Coordination normal. GCS eye subscore is 4. GCS verbal subscore is 5. GCS motor subscore is 6.  Skin: Skin is warm and dry.   ED Course  Procedures (including critical care time) Labs Review Labs Reviewed  CBC - Abnormal; Notable for the following:    WBC 11.0 (*)    All other components within normal limits  BASIC METABOLIC PANEL - Abnormal; Notable for the following:    Glucose, Bld 108 (*)    GFR calc non Af Amer 77 (*)    GFR calc Af Amer 90 (*)    All other components within normal limits  TROPONIN I - Abnormal; Notable for the following:    Troponin I 10.37 (*)    All other components within normal limits  PRO B  NATRIURETIC PEPTIDE - Abnormal; Notable for the following:    Pro B Natriuretic peptide (BNP) 774.3 (*)    All other components within normal limits  POCT I-STAT TROPONIN I - Abnormal; Notable for the following:    Troponin i, poc 1.90 (*)    All other components within normal limits  MRSA PCR SCREENING  DIFFERENTIAL  PROTIME-INR  APTT  MAGNESIUM  HEPARIN LEVEL (UNFRACTIONATED)  CBC  TROPONIN I  TSH  HEMOGLOBIN A1C  LIPID PANEL  BASIC METABOLIC PANEL   Imaging Review Dg Chest Portable 1 View  12/26/2013   CLINICAL DATA:  Chest pain  EXAM: PORTABLE CHEST - 1 VIEW  COMPARISON:  02/22/2013  FINDINGS: Postop changes left lung base with surgical clips in the left  lower hilum and scarring in the left lung base, unchanged. Negative for mass lesion. Negative for pneumonia or heart failure.  IMPRESSION: Postop changes left lung base are stable. No superimposed acute abnormality.   Electronically Signed   By: Marlan Palau M.D.   On: 12/26/2013 19:15    EKG Interpretation    Date/Time:  Sunday December 26 2013 18:25:36 EST Ventricular Rate:  82 PR Interval:  156 QRS Duration: 94 QT Interval:  355 QTC Calculation: 415 R Axis:   11 Text Interpretation:  Sinus rhythm Low voltage, precordial leads Right ventricular hypertrophy Probable anterolateral infarct, old Confirmed by Bebe Shaggy  MD, DONALD [phone]) on 12/26/2013 6:42:54 PM           MDM   Here with CP. Started this AM. Pre-hospital EKG with concern for STE in lateral leads. Repeat here with 1 mm STE in V5 and V6. Code STEMI activitated prior to arrival. Evaluated by cardiology on arrival. Code STEMI cancelled by cardiologist. ASA given prehospital. Troponin eleavted to 1.90. Heparin given. The patient was admitted to cardiology.   1. Non-STEMI (non-ST elevated myocardial infarction)        Shanon Ace, MD 12/27/13 [phone]

## 2013-12-26 NOTE — ED Notes (Signed)
Report to Rickey. Pt to floor on monitor with RN accompanying.

## 2013-12-26 NOTE — ED Notes (Signed)
Attempted to call report.  RN in isolation room; will return my call.

## 2013-12-26 NOTE — ED Notes (Signed)
Pt arrived per [facility] as a code stemi.  PT given ASA and nitro per EMS . Pt reports CP since this AM. Pt reported CP 4/10 with relief after 2 SL. Nitro.Pt reported pain free on arrival.

## 2013-12-26 NOTE — ED Provider Notes (Signed)
Pt seen on arrival He is here for CP prehospital EKG shows possible STEMI He is now CP free Seen by cardiology fellow kelly, who spoke to attending tom kelly Given pt is improving, no distress, and ?St elevation in setting of dextrocardia, will not send urgently to cath lab Cardiology will admit patient   EKG Interpretation    Date/Time:  Sunday December 26 2013 18:25:36 EST Ventricular Rate:  82 PR Interval:  156 QRS Duration: 94 QT Interval:  355 QTC Calculation: 415 R Axis:   11 Text Interpretation:  Sinus rhythm Low voltage, precordial leads Right ventricular hypertrophy Probable anterolateral infarct, old Confirmed by Bebe Shaggy  MD, Adelia Baptista [id number]) on 12/26/2013 6:42:54 PM             Joya Gaskins, MD 12/26/13 1844

## 2013-12-26 NOTE — H&P (Addendum)
Jason Roberts is an 57 y.o. male.   Chief Complaint: chest pain HPI: Jason Roberts is a 57 yo man with PMH of anal fissure, dextrocardia, general arthritis and hemorrhoids who was called in by EMS as STEMI. He had CP that began at 11 am and continued to persist ultimately leading to calling EMS by his family given persistent symptoms. He describes the CP as pressure-like, substernal. The pain occurred at rest. When he received SL NTG, the pain resolved promptly and he's remained CP free. He has dextrocardia so I reviewed multiple ECG tracing from the EMS team and ER team and he doesn't meet STEMI criteria specifically by ECG but there does appear to be some injury pattern. I discussed the case, ECG with Dr. Tresa Endo who agreed so we are currently treated as NSTEMI unless symptoms recur or change. On my way out, his wife had arrived and told me he's been have stuttering chest pain for 2-3 weeks and they were finally able to get him to come in.   Past Medical History  Diagnosis Date  . Hemorrhoids     Past Surgical History  Procedure Laterality Date  . Lll lung  1977    due to empyema  . Laminectomy    . Left arm chain saw injury      Family History  Problem Relation Age of Onset  . Diabetes Mother   . Hypertension Mother   . Stroke Father    Social History:  reports that he has been smoking Cigarettes.  He has been smoking about 1.00 pack per day. He does not have any smokeless tobacco history on file. He reports that he does not drink alcohol or use illicit drugs.  Allergies:  Allergies  Allergen Reactions  . Penicillins Swelling   Home medications reviewed;  Current Facility-Administered Medications  Medication Dose Route Frequency Provider Last Rate Last Dose  . heparin ADULT infusion 100 units/mL (25000 units/250 mL)  1,250 Units/hr Intravenous Continuous Sallee Provencal, RPH 12.5 mL/hr at 12/26/13 1934 1,250 Units/hr at 12/26/13 1934   Current Outpatient Prescriptions   Medication Sig Dispense Refill  . beclomethasone (QVAR) 80 MCG/ACT inhaler Inhale 1 puff into the lungs as needed.  1 Inhaler  2  . cefdinir (OMNICEF) 300 MG capsule Take 1 capsule (300 mg total) by mouth 2 (two) times daily.  20 capsule  0  . doxycycline (VIBRAMYCIN) 100 MG capsule       . HYDROcodone-homatropine (HYCODAN) 5-1.5 MG/5ML syrup       . hydrocortisone (ANUSOL-HC) 25 MG suppository Place 1 suppository (25 mg total) rectally 2 (two) times daily.  12 suppository  0  . levothyroxine (SYNTHROID, LEVOTHROID) 50 MCG tablet       . predniSONE (DELTASONE) 20 MG tablet 3-2-2-1-1-1 daily with food  13 tablet  0  . traZODone (DESYREL) 50 MG tablet Take 50 mg by mouth at bedtime.      Marland Kitchen zolpidem (AMBIEN) 5 MG tablet TAKE ONE TABLET BY MOUTH AT BEDTIME AS NEEDED FOR SLEEP  30 tablet  0     (Not in a hospital admission)  Results for orders placed during the hospital encounter of 12/26/13 (from the past 48 hour(s))  CBC     Status: Abnormal   Collection Time    12/26/13  6:30 PM      Result Value Range   WBC 11.0 (*) 4.0 - 10.5 K/uL   RBC 4.70  4.22 - 5.81 MIL/uL   Hemoglobin 15.9  13.0 - 17.0 g/dL   HCT 16.1  09.6 - 04.5 %   MCV 97.0  78.0 - 100.0 fL   MCH 33.8  26.0 - 34.0 pg   MCHC 34.9  30.0 - 36.0 g/dL   RDW 40.9  81.1 - 91.4 %   Platelets 218  150 - 400 K/uL  DIFFERENTIAL     Status: None   Collection Time    12/26/13  6:30 PM      Result Value Range   Neutrophils Relative % 60  43 - 77 %   Neutro Abs 6.6  1.7 - 7.7 K/uL   Lymphocytes Relative 30  12 - 46 %   Lymphs Abs 3.3  0.7 - 4.0 K/uL   Monocytes Relative 9  3 - 12 %   Monocytes Absolute 1.0  0.1 - 1.0 K/uL   Eosinophils Relative 1  0 - 5 %   Eosinophils Absolute 0.1  0.0 - 0.7 K/uL   Basophils Relative 0  0 - 1 %   Basophils Absolute 0.0  0.0 - 0.1 K/uL  PROTIME-INR     Status: None   Collection Time    12/26/13  6:30 PM      Result Value Range   Prothrombin Time 12.7  11.6 - 15.2 seconds   INR 0.97  0.00 -  1.49  APTT     Status: None   Collection Time    12/26/13  6:30 PM      Result Value Range   aPTT 34  24 - 37 seconds  BASIC METABOLIC PANEL     Status: Abnormal   Collection Time    12/26/13  6:30 PM      Result Value Range   Sodium 138  135 - 145 mEq/L   Potassium 4.2  3.5 - 5.1 mEq/L   Chloride 101  96 - 112 mEq/L   CO2 28  19 - 32 mEq/L   Glucose, Bld 108 (*) 70 - 99 mg/dL   BUN 18  6 - 23 mg/dL   Creatinine, Ser 7.82  0.50 - 1.35 mg/dL   Calcium 9.3  8.4 - 95.6 mg/dL   GFR calc non Af Amer 77 (*) >90 mL/min   GFR calc Af Amer 90 (*) >90 mL/min   Comment: (NOTE)     The eGFR has been calculated using the CKD EPI equation.     This calculation has not been validated in all clinical situations.     eGFR's persistently <90 mL/min signify possible Chronic Kidney     Disease.  POCT I-STAT TROPONIN I     Status: Abnormal   Collection Time    12/26/13  6:37 PM      Result Value Range   Troponin i, poc 1.90 (*) 0.00 - 0.08 ng/mL   Comment NOTIFIED PHYSICIAN     Comment 3            Comment: Due to the release kinetics of cTnI,     a negative result within the first hours     of the onset of symptoms does not rule out     myocardial infarction with certainty.     If myocardial infarction is still suspected,     repeat the test at appropriate intervals.   Dg Chest Portable 1 View  12/26/2013   CLINICAL DATA:  Chest pain  EXAM: PORTABLE CHEST - 1 VIEW  COMPARISON:  02/22/2013  FINDINGS: Postop changes left lung base with surgical  clips in the left lower hilum and scarring in the left lung base, unchanged. Negative for mass lesion. Negative for pneumonia or heart failure.  IMPRESSION: Postop changes left lung base are stable. No superimposed acute abnormality.   Electronically Signed   By: Marlan Palau M.D.   On: 12/26/2013 19:15    Review of Systems  Constitutional: Positive for malaise/fatigue. Negative for fever, chills and weight loss.  HENT: Negative for ear discharge and  ear pain.   Eyes: Negative for blurred vision and pain.  Respiratory: Negative for cough, hemoptysis and sputum production.   Cardiovascular: Positive for chest pain. Negative for palpitations and orthopnea.  Gastrointestinal: Negative for heartburn, nausea and vomiting.  Genitourinary: Negative for dysuria and urgency.  Musculoskeletal: Positive for joint pain. Negative for back pain, myalgias and neck pain.  Skin: Negative for rash.  Neurological: Negative for dizziness, tingling, tremors and headaches.  Endo/Heme/Allergies: Negative for environmental allergies.  Psychiatric/Behavioral: Positive for substance abuse. Negative for depression, suicidal ideas and hallucinations.    Blood pressure 131/89, pulse 74, resp. rate 21, SpO2 96.00%. Physical Exam  Nursing note and vitals reviewed. Constitutional: He is oriented to person, place, and time. He appears well-developed and well-nourished. No distress.  HENT:  Head: Normocephalic and atraumatic.  Nose: Nose normal.  Mouth/Throat: Oropharynx is clear and moist. No oropharyngeal exudate.  Eyes: Conjunctivae and EOM are normal. Pupils are equal, round, and reactive to light. No scleral icterus.  Neck: Normal range of motion. Neck supple. No JVD present. No tracheal deviation present. No thyromegaly present.  Cardiovascular: Normal rate, regular rhythm, normal heart sounds and intact distal pulses.  Exam reveals no gallop.   No murmur heard. Heart sounds right-sided  Respiratory: Effort normal and breath sounds normal. No respiratory distress. He has no wheezes. He has no rales.  GI: Soft. Bowel sounds are normal. He exhibits no distension. There is no tenderness. There is no rebound.  Musculoskeletal: Normal range of motion. He exhibits no edema and no tenderness.  Neurological: He is alert and oriented to person, place, and time. He has normal reflexes. No cranial nerve deficit.  Skin: Skin is warm and dry. No rash noted. He is not  diaphoretic. No erythema.  Psychiatric: He has a normal mood and affect. His behavior is normal. Thought content normal.    Labs reviewed; Trop 1.9 POC, Cr 1.04, bun 18, na 138, K 4.2, wbc 11, h/h 15.9/45.6, plt 200s INR 1.0 ECG: sinus rhythm, poor R-wave progression; reviewed multiple right-sided ECGS without focal ST elevation; perhaps q's in lateral leads  Problem List NSTEMI Tobacco abuse Dextrocardia Insomnia  Assessment/Plan 57 yo man with dextrocardia and tobacco use here with chest pain, currently chest pain free and found to have NSTEMI. On heparin gtt, aspirin 324 mg given and now 81 mg daily. Will plan for [facility] tomorrow. NPO after MN - heparin gtt, will use NTG if change in symptoms - low dose metoprolol 12.5 mg bid - atorvastatin 80 mg qHS - lipid panel, tsh, BNP, magnesium, hba1c - nicotine patch - smoking cessation counseling daily (started already) - has synthroid on med rec - will need to follow-up, TSH pending  Serai Tukes 12/26/2013, 8:36 PM

## 2013-12-27 ENCOUNTER — Encounter (HOSPITAL_COMMUNITY)
Admission: EM | Disposition: A | Discharge status: Home / Self Care | Payer: Self-pay | Source: Home / Self Care | Attending: Surgery

## 2013-12-27 ENCOUNTER — Ambulatory Visit (HOSPITAL_COMMUNITY): Admit: 2013-12-27 | Payer: Self-pay | Admitting: Cardiology

## 2013-12-27 ENCOUNTER — Other Ambulatory Visit: Payer: Self-pay | Admitting: *Deleted

## 2013-12-27 DIAGNOSIS — E785 Hyperlipidemia, unspecified: Secondary | ICD-10-CM | POA: Diagnosis present

## 2013-12-27 DIAGNOSIS — I517 Cardiomegaly: Secondary | ICD-10-CM

## 2013-12-27 DIAGNOSIS — I2510 Atherosclerotic heart disease of native coronary artery without angina pectoris: Secondary | ICD-10-CM

## 2013-12-27 HISTORY — PX: LEFT HEART CATHETERIZATION WITH CORONARY ANGIOGRAM: SHX5451

## 2013-12-27 HISTORY — PX: TRANSTHORACIC ECHOCARDIOGRAM: SHX275

## 2013-12-27 LAB — CBC
HCT: 43.3 % (ref 39.0–52.0)
MCHC: 34.9 g/dL (ref 30.0–36.0)
MCV: 96.9 fL (ref 78.0–100.0)
RBC: 4.47 MIL/uL (ref 4.22–5.81)
RDW: 12.8 % (ref 11.5–15.5)
WBC: 9.6 10*3/uL (ref 4.0–10.5)

## 2013-12-27 LAB — BASIC METABOLIC PANEL
BUN: 19 mg/dL (ref 6–23)
Calcium: 8.9 mg/dL (ref 8.4–10.5)
Chloride: 102 mEq/L (ref 96–112)
Creatinine, Ser: 0.96 mg/dL (ref 0.50–1.35)
GFR calc Af Amer: >90 mL/min (ref >90)
GFR calc non Af Amer: 90 mL/min — ABNORMAL LOW (ref >90)
Potassium: 3.8 mEq/L (ref 3.5–5.1)

## 2013-12-27 LAB — LIPID PANEL
Cholesterol: 201 mg/dL — ABNORMAL HIGH (ref 0–200)
HDL: 39 mg/dL — ABNORMAL LOW (ref >39)
LDL Cholesterol: 111 mg/dL — ABNORMAL HIGH (ref 0–99)
Total CHOL/HDL Ratio: 5.2 RATIO
VLDL: 51 mg/dL — ABNORMAL HIGH (ref 0–40)

## 2013-12-27 LAB — MRSA PCR SCREENING: MRSA by PCR: NEGATIVE

## 2013-12-27 LAB — SURGICAL PCR SCREEN: MRSA, PCR: NEGATIVE

## 2013-12-27 LAB — TROPONIN I: Troponin I: 17.37 ng/mL (ref <0.30)

## 2013-12-27 LAB — HEPARIN LEVEL (UNFRACTIONATED): Heparin Unfractionated: 0.34 IU/mL (ref 0.30–0.70)

## 2013-12-27 SURGERY — LEFT HEART CATHETERIZATION WITH CORONARY ANGIOGRAM
Anesthesia: LOCAL

## 2013-12-27 MED ORDER — HEPARIN (PORCINE) IN NACL 100-0.45 UNIT/ML-% IJ SOLN
1450.0000 [IU]/h | INTRAMUSCULAR | Status: DC
  Administered 2013-12-27 (×3): 1450 [IU]/h via INTRAVENOUS
  Filled 2013-12-27 (×2): qty 250

## 2013-12-27 MED ORDER — HEPARIN SODIUM (PORCINE) 1000 UNIT/ML IJ SOLN
INTRAMUSCULAR | Status: AC
  Filled 2013-12-27: qty 1

## 2013-12-27 MED ORDER — MIDAZOLAM HCL 2 MG/2ML IJ SOLN
INTRAMUSCULAR | Status: AC
  Filled 2013-12-27: qty 2

## 2013-12-27 MED ORDER — HEPARIN BOLUS VIA INFUSION
2500.0000 [IU] | Freq: Once | INTRAVENOUS | Status: AC
  Administered 2013-12-27: 2500 [IU] via INTRAVENOUS
  Filled 2013-12-27: qty 2500

## 2013-12-27 MED ORDER — HEPARIN (PORCINE) IN NACL 2-0.9 UNIT/ML-% IJ SOLN
INTRAMUSCULAR | Status: AC
  Filled 2013-12-27: qty 1500

## 2013-12-27 MED ORDER — DIAZEPAM 2 MG PO TABS
2.0000 mg | ORAL_TABLET | ORAL | Status: AC
  Administered 2013-12-27: 2 mg via ORAL
  Filled 2013-12-27: qty 1

## 2013-12-27 MED ORDER — MORPHINE SULFATE 2 MG/ML IJ SOLN
2.0000 mg | INTRAMUSCULAR | Status: DC | PRN
  Administered 2013-12-27: 2 mg via INTRAVENOUS
  Filled 2013-12-27: qty 1

## 2013-12-27 MED ORDER — TRAZODONE HCL 150 MG PO TABS
150.0000 mg | ORAL_TABLET | Freq: Every day | ORAL | Status: DC
  Administered 2013-12-27 – 2013-12-28 (×2): 150 mg via ORAL
  Filled 2013-12-27 (×3): qty 1

## 2013-12-27 MED ORDER — LIDOCAINE HCL (PF) 1 % IJ SOLN
INTRAMUSCULAR | Status: AC
  Filled 2013-12-27: qty 30

## 2013-12-27 MED ORDER — SODIUM CHLORIDE 0.9 % IV SOLN: 250.0000 mL | INTRAVENOUS | Status: DC | PRN

## 2013-12-27 MED ORDER — SODIUM CHLORIDE 0.9 % IJ SOLN
3.0000 mL | Freq: Two times a day (BID) | INTRAMUSCULAR | Status: DC
  Administered 2013-12-27: 3 mL via INTRAVENOUS

## 2013-12-27 MED ORDER — SODIUM CHLORIDE 0.9 % IV SOLN: INTRAVENOUS | Status: DC

## 2013-12-27 MED ORDER — NITROGLYCERIN 0.2 MG/ML ON CALL CATH LAB
INTRAVENOUS | Status: AC
  Filled 2013-12-27: qty 1

## 2013-12-27 MED ORDER — SODIUM CHLORIDE 0.9 % IV SOLN: 1.0000 mL/kg/h | INTRAVENOUS | Status: AC

## 2013-12-27 MED ORDER — LEVOTHYROXINE SODIUM 50 MCG PO TABS
50.0000 ug | ORAL_TABLET | Freq: Every day | ORAL | Status: DC
  Administered 2013-12-28 – 2014-01-03 (×7): 50 ug via ORAL
  Filled 2013-12-27 (×9): qty 1

## 2013-12-27 MED ORDER — FENTANYL CITRATE 0.05 MG/ML IJ SOLN
INTRAMUSCULAR | Status: AC
  Filled 2013-12-27: qty 2

## 2013-12-27 MED ORDER — ASPIRIN EC 325 MG PO TBEC
325.0000 mg | DELAYED_RELEASE_TABLET | Freq: Every day | ORAL | Status: DC
  Administered 2013-12-27 – 2013-12-28 (×2): 325 mg via ORAL
  Filled 2013-12-27 (×3): qty 1

## 2013-12-27 MED ORDER — SODIUM CHLORIDE 0.9 % IJ SOLN: 3.0000 mL | INTRAMUSCULAR | Status: DC | PRN

## 2013-12-27 MED ORDER — HEPARIN (PORCINE) IN NACL 100-0.45 UNIT/ML-% IJ SOLN
1500.0000 [IU]/h | INTRAMUSCULAR | Status: DC
  Filled 2013-12-27 (×2): qty 250

## 2013-12-27 MED ORDER — HEPARIN (PORCINE) IN NACL 2-0.9 UNIT/ML-% IJ SOLN
INTRAMUSCULAR | Status: AC
  Filled 2013-12-27: qty 500

## 2013-12-27 MED ORDER — SODIUM CHLORIDE 0.9 % IV SOLN
1.0000 mL/kg/h | INTRAVENOUS | Status: DC
  Administered 2013-12-27: 1 mL/kg/h via INTRAVENOUS

## 2013-12-27 MED ORDER — INFLUENZA VAC SPLIT QUAD 0.5 ML IM SUSP
0.5000 mL | INTRAMUSCULAR | Status: AC
  Administered 2013-12-28: 0.5 mL via INTRAMUSCULAR
  Filled 2013-12-27: qty 0.5

## 2013-12-27 MED ORDER — ASPIRIN 81 MG PO CHEW
81.0000 mg | CHEWABLE_TABLET | ORAL | Status: AC
  Administered 2013-12-27: 81 mg via ORAL
  Filled 2013-12-27: qty 1

## 2013-12-27 MED ORDER — VERAPAMIL HCL 2.5 MG/ML IV SOLN
INTRAVENOUS | Status: AC
  Filled 2013-12-27: qty 2

## 2013-12-27 NOTE — Progress Notes (Signed)
  Echocardiogram 2D Echocardiogram has been performed.  Jason Roberts 12/27/2013, 10:37 AM

## 2013-12-27 NOTE — CV Procedure (Signed)
CARDIAC CATHETERIZATION REPORT  NAME:  OVILA LEPAGE   MRN: [id number] DOB:  June 18, 1956   ADMIT DATE: 12/26/2013 Procedure Date: 12/27/2013  INTERVENTIONAL CARDIOLOGIST: Marykay Lex, M.D., MS PRIMARY CARE PROVIDER: Thora Lance, MD PRIMARY CARDIOLOGIST:  Marykay Lex, MD, MS  PATIENT:  Jason Roberts is a 57 y.o. male with Dextrocardia who presented with ACS - (cancelled STEMI) who ruled in for significant NSTEMI. Was pain free after initial Rx & maintained no Heparin overnight.  Confirmation with the patient's wife reveals that he has been having stuttering off and on chest pain for the last several weeks. He is referred for Cardiac Catheterization +/- PCI.  PRE-OPERATIVE DIAGNOSIS:    NSTEMI  DEXTROCARDIA  PROCEDURES PERFORMED:    LEFT HEART CATHETERIZATION WITH CORONARY ANGIOGRAPY  PROCEDURE:Consent:  Risks of procedure as well as the alternatives and risks of each were explained to the (patient/caregiver).  Consent for procedure obtained. Consent for signed by MD and patient with RN witness -- placed on chart.   PROCEDURE: The patient was brought to the 2nd Floor [facility] in the fasting state and prepped and draped in the usual sterile fashion for Right groin or radial access. A modified Allen's test with plethysmography was performed, revealing excellent Ulnar artery collateral flow.  Sterile technique was used including antiseptics, cap, gloves, gown, hand hygiene, mask and sheet.  Skin prep: Chlorhexidine.  Time Out: Verified patient identification, verified procedure, site/side was marked, verified correct patient position, special equipment/implants available, medications/allergies/relevent history reviewed, required imaging and test results available.  Performed  Access: RIGHT RADIAL Artery; 6 Fr Sheath -- Seldinger technique (Angiocath Micropuncture Kit)  IA Radial Cocktail, IV Heparin  Initial attempt at cannulating the  radial artery with brisk Angiocath flow, resulted in unsuccessful attempt to access the artery.  The sheath was inserted under fluoroscopic guidance, however no blood was aspirated. Therefore the sheath was removed, and manual pressure held.  After adequate hemostasis was obtained, the renal artery was accessed and upstream site and successfully cannulated  Diagnostic Left Heart Catheterization: 5 Fr TIG 4.0, and Angled Pigtail advanced and exchanged over long exchange safety J-wire  Right &  Left Coronary Artery Angiography: TIG 4.0  LV Hemodynamics (LV Gram): Angled pick  TR Band:  1615 Hours, at mL air; nonocclusive hemostasis was assured with plethysmography  MEDICATIONS:  Anesthesia:  Local Lidocaine 2 ml  Sedation:  2 mg IV Versed, 50 mcg IV fentanyl ;   Premedication: 2 mg oral Valium  Omnipaque Contrast: 90 ml  Anticoagulation:  IV Heparin 4000 Units ;  Radial Cocktail: 5 mg Verapamil, 400 mcg NTG, 2 ml 2% Lidocaine in 10 ml NS  Hemodynamics:  Central Aortic / Mean Pressures: 102/57 mmHg; 80 mmHg  Left Ventricular Pressures / EDP: 104/12 mmHg; 15 mmHg  Left Ventriculography:  EF: 50-55 %  Wall Motion: Basal-mid inferior hypokinesis  Coronary Anatomy:  Left Main: Large caliber vessel that trifurcates into the LAD, Circumflex, and small-caliber Ramus Intermedius; angiographically normal   LAD: Large-caliber vessel, that wraps the apex. There is a 80% relatively focal stenosis at the bifurcation point for the first major diagonal branch (D1) and a large septal perforator. Beyond the stenosis there is a large branching septal trunk, then the LAD tapers down around the apex giving rise to one smaller diagonal branch distally. There is brisk collateral flow from septal perforators and the distal LAD to the RPDA that fills retrograde to the Right Posterolateral branches.  D1:  Moderate caliber vessel that takes off at the site of the focal 80% LAD lesion. The diagonal itself  is relatively free of disease.  Left Circumflex: Very large caliber vessel that gives off 2 moderate -caliber OM branches (OM 1 and OM 2) then bifurcates into the small caliber AV groove circumflex and a large OM 3. There is a small atrial branch that comes off in the site as well.  OM1 and 2: Moderate caliber vessels, OM 2 has a roughly 40% proximal stenosis and is a larger of the 2 branches.  OM 3: Moderate-large-caliber vessel with a proximal eccentric 90% stenosis  Ramus intermedius: Small-caliber vessel with minimal luminal irregularities.   RCA: Very large caliber vessel with an anterior takeoff. There is a large atrial branch followed by 2 RV marginal branches. The vessel is then is 100% thrombotic occluded with no distal flow beyond the mid vessel.  Collateral flow from the LAD fills the RPDA with retrograde flow to the  Right Posterior AV Groove Branch (RPAV) both of which are relatively free of disease. There is no retrograde flow up the native RCA to the occlusion site.  PATIENT DISPOSITION:    The patient was transferred to the PACU holding area in a hemodynamicaly stable, chest pain free condition.  The patient tolerated the procedure well, and there were no complications.  EBL:   < 10 ml  The patient was stable before, during, and after the procedure.  There is a small hematoma at the cath insertion site.  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  Relatively well-preserved LV function with expected inferior hypokinesis.  PLAN OF CARE:  Transferred to CCU/TCU for post catheterization care given the extent of CAD  Standard post radial cath care with a TR band.  CV TS has been consulted for CABG consultation  Restart Heparin 6 hours post TR band removal  Continue aspirin and statin as well as low-dose beta blocker   HARDING,DAVID W, M.D., M.S. [facility] [address] [place], [place]  [address]  [phone]  12/27/2013 4:31 PM

## 2013-12-27 NOTE — Progress Notes (Signed)
ANTICOAGULATION CONSULT NOTE - Follow- Up Consult  Pharmacy Consult for Heparin Indication: chest pain/ACS  Allergies  Allergen Reactions  . Penicillins Swelling    Patient Measurements: Height: 5\' 11"  (180.3 cm) Weight: 201 lb 15.1 oz (91.6 kg) IBW/kg (Calculated) : 75.3 Wt Readings from Last 3 Encounters:  12/27/13 201 lb 15.1 oz (91.6 kg)  03/20/13 215 lb (97.523 kg)  02/25/13 231 lb 12.8 oz (105.144 kg)   Heparin Dosing Weight: ~90 kg  Vital Signs: Temp: 98 F (36.7 C) (12/29 0354) Temp src: Axillary (12/29 0354) BP: 105/67 mmHg (12/29 0354) Pulse Rate: 77 (12/29 0354)  Labs:  Recent Labs  12/26/13 1830 12/26/13 2226 12/27/13 0325  HGB 15.9  --  15.1  HCT 45.6  --  43.3  PLT 218  --  209  APTT 34  --   --   LABPROT 12.7  --   --   INR 0.97  --   --   HEPARINUNFRC  --   --  0.19*  CREATININE 1.04  --   --   TROPONINI  --  10.37*  --     Estimated Creatinine Clearance: 89.6 ml/min (by C-G formula based on Cr of 1.04).   Medical History: Past Medical History  Diagnosis Date  . Hemorrhoids   . Dextrocardia     Medications:  . heparin       Assessment: 57 year old male presenting with chest pain to begin anticoagulation with Heparin.  No anticoagulants noted in his history.  Initial heparin level low on 1250 units/hr.  No bleeding or complications noted.  Per RN, no issues with infusion.  Planning to go to the cath lab later today.  Goal of Therapy:  Heparin level 0.3-0.7 units/ml Monitor platelets by anticoagulation protocol: Yes   Plan:  1. Heparin 2500 units x 1 2. Increase heparin gtt to 1450 units/hr. 3. Recheck heparin level in 6 hrs. 4. F/u p cath.  Tad Moore, BCPS  Clinical Pharmacist Pager [phone]  12/27/2013 3:56 AM

## 2013-12-27 NOTE — Progress Notes (Signed)
Pt has been NPO since MN. R wrist and R groin shaved in preparation for Cardiac cath. Heparin drip infusing. Pt has been pain free throughout the night.

## 2013-12-27 NOTE — Progress Notes (Signed)
Utilization Review Completed.  

## 2013-12-27 NOTE — Progress Notes (Signed)
Subjective:  No further chest pain  Objective:  Vital Signs in the last 24 hours: Temp:  [97.8 F (36.6 C)-98 F (36.7 C)] 98 F (36.7 C) (12/29 0354) Pulse Rate:  [65-93] 68 (12/29 0600) Resp:  [9-25] 17 (12/29 0600) BP: (101-146)/(62-101) 118/79 mmHg (12/29 0600) SpO2:  [94 %-98 %] 95 % (12/29 0600) Weight:  [201 lb 4.5 oz (91.3 kg)-201 lb 15.1 oz (91.6 kg)] 201 lb 15.1 oz (91.6 kg) (12/29 0354)  Intake/Output from previous day:  Intake/Output Summary (Last 24 hours) at 12/27/13 0816 Last data filed at 12/27/13 0600  Gross per 24 hour  Intake  116.5 ml  Output      0 ml  Net  116.5 ml    Physical Exam: General appearance: alert, cooperative and no distress Lungs: few rhonchi Heart: regular rate and rhythm ABd: soft/NT/ ND /NABS Ext: no C/C/E Neuro: CN II-XII grossly intact   Rate: 70  Rhythm: normal sinus rhythm  Lab Results:  Recent Labs  12/26/13 1830 12/27/13 0325  WBC 11.0* 9.6  HGB 15.9 15.1  PLT 218 209    Recent Labs  12/26/13 1830 12/27/13 0325  NA 138 139  K 4.2 3.8  CL 101 102  CO2 28 28  GLUCOSE 108* 91  BUN 18 19  CREATININE 1.04 0.96    Recent Labs  12/26/13 2226 12/27/13 0325  TROPONINI 10.37* 17.37*    Recent Labs  12/26/13 1830  INR 0.97    Imaging: Imaging results have been reviewed  Cardiac Studies:  Assessment/Plan:   Principal Problem:   Non-STEMI (non-ST elevated myocardial infarction) Active Problems:   Dextrocardia   Tobacco abuse   Dyslipidemia  PLAN: Cath today, continue statin, ASA, beta blocker. Stop Heparin on call to lab. Smoking cessation.  Corine Shelter PA-C Beeper [phone] 12/27/2013, 8:16 AM  I have seen and evaluated the patient this AM along with Corine Shelter, PA. I agree with his findings, examination as well as impression recommendations.  57 y/o with HLD & tobacco abuse & known Detrocardia - p/w NSTEMI.   Now CP Free - plan LHC +/- PCI today.    Performing MD:  Marykay Lex, M.D.,  M.S.  Procedure:  LEFT HEART CATHETERIZATION WITH CORONARY ANGIOGRAPHY +/- PCI  The procedure with Risks/Benefits/Alternatives and Indications was reviewed with the patient.  All questions were answered.    Risks / Complications include, but not limited to: Death, MI, CVA/TIA, VF/VT (with defibrillation), Bradycardia (need for temporary pacer placement), contrast induced nephropathy, bleeding / bruising / hematoma / pseudoaneurysm, vascular or coronary injury (with possible emergent CT or Vascular Surgery), adverse medication reactions, infection.    The patient voices understanding and agree to proceed.    Marykay Lex, M.D., M.S. [facility] [address] [place], [place]  [address]  [phone] Pager # [phone] 12/27/2013 8:25 AM

## 2013-12-27 NOTE — Progress Notes (Signed)
ANTICOAGULATION CONSULT NOTE - Follow Up Consult  Pharmacy Consult:  Heparin Indication: chest pain/ACS  Allergies  Allergen Reactions  . Penicillins Swelling    Patient Measurements: Height: 5\' 10"  (177.8 cm) Weight: 205 lb 0.4 oz (93 kg) IBW/kg (Calculated) : 73 Heparin Dosing Weight: 90 kg  Vital Signs: Temp: 98.1 F (36.7 C) (12/29 1700) Temp src: Oral (12/29 1700) BP: 138/88 mmHg (12/29 1800) Pulse Rate: 81 (12/29 1252)  Labs:  Recent Labs  12/26/13 1830 12/26/13 2226 12/27/13 0325 12/27/13 0950  HGB 15.9  --  15.1  --   HCT 45.6  --  43.3  --   PLT 218  --  209  --   APTT 34  --   --   --   LABPROT 12.7  --   --   --   INR 0.97  --   --   --   HEPARINUNFRC  --   --  0.19* 0.34  CREATININE 1.04  --  0.96  --   TROPONINI  --  10.37* 17.37*  --     Estimated Creatinine Clearance: 96.1 ml/min (by C-G formula based on Cr of 0.96).      Assessment: 82 YOM admitted with chest pain and started on IV heparin.  He is s/p cath today which showed severe multi-vessel CAD and to proceed with CABG on Wednesday.  Patient to resume IV heparin 6 hours post TR band removal (removed at 1815 with no bleeding/hematoma per RN).  He was previously therapeutic on 1450 units/hr.   Goal of Therapy:  Heparin level 0.3-0.7 units/ml Monitor platelets by anticoagulation protocol: Yes    Plan:  - At 0030 on 12/28/13, resume IV heparin gtt at 1500 units/hr - Check 6 hr HL - Daily HL / CBC     Gill Delrossi D. Laney Potash, PharmD, BCPS Pager:  [phone] 12/27/2013, 7:22 PM

## 2013-12-27 NOTE — Interval H&P Note (Signed)
History and Physical Interval Note:  12/27/2013 8:30 AM  Jason Roberts  has presented today for surgery, with the diagnosis of NSTEMI. The various methods of treatment have been discussed with the patient and family. After consideration of risks, benefits and other options for treatment, the patient has consented to  Procedure(s): LEFT HEART CATHETERIZATION WITH CORONARY ANGIOGRAM (N/A)  +/- PCI as a surgical intervention .  The patient's history has been reviewed, patient examined, no change in status, stable for surgery.  I have reviewed the patient's chart and labs.  Questions were answered to the patient's satisfaction.     Jason Roberts  Cath Lab Visit (complete for each Cath Lab visit)  Clinical Evaluation Leading to the Procedure:   ACS: yes  Non-ACS:    Anginal Classification: CCS IV  Anti-ischemic medical therapy: Minimal Therapy (1 class of medications)  Non-Invasive Test Results: No non-invasive testing performed  Prior CABG: No previous CABG

## 2013-12-27 NOTE — Brief Op Note (Signed)
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  Was pain free after initial Rx & maintained no Heparin overnight. He is referred for  Cardiac Catheterization +/- PCI.  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  PROCEDURE:  Procedure(s): LEFT HEART CATHETERIZATION WITH CORONARY ANGIOGRAM (N/A)  SURGEON:  Surgeon(s) and Role:    * Marykay Lex, MD - Primary  ANESTHESIA:   local and IV sedation  EBL:  Total I/O In: -  Out: 350 [Urine:350]  BLOOD ADMINISTERED:none  PROCEDURE: 6Fr R Radial A Access (Seldinger); initial sheath insertion was not intraluminal; sheath withdrawn & re-inserted.  small hematoma)   R&LCA ANGIOGRAPHY - 5 FrTig 4.0  LEFT VENTRICULOGRAPHY - Angled Pigtail Catheter  LOCAL MEDICATIONS USED:  LIDOCAINE   TOURNIQUET:  TR BAND 8ml Air; 1615 hrs  DICTATION: .Note written in EPIC  PLAN OF CARE: Transfer to CCU; CVTS has been consulted; Restart IV Heparin 6 hr post TR band removal.  PATIENT DISPOSITION:  ICU - stable.   Delay start of Pharmacological VTE agent (>24hrs) due to surgical blood loss or risk of bleeding: not applicable  HARDING,DAVID W, M.D., M.S. [facility] [address] [place], [place]  [address]  [phone] Pager # [phone] 12/27/2013 4:38 PM

## 2013-12-27 NOTE — Progress Notes (Signed)
ANTICOAGULATION CONSULT NOTE - Follow- Up Consult  Pharmacy Consult for Heparin Indication: chest pain/ACS  Allergies  Allergen Reactions  . Penicillins Swelling    Patient Measurements: Height: 5\' 11"  (180.3 cm) Weight: 200 lb 14.4 oz (91.128 kg) IBW/kg (Calculated) : 75.3 Wt Readings from Last 3 Encounters:  12/27/13 200 lb 14.4 oz (91.128 kg)  12/27/13 200 lb 14.4 oz (91.128 kg)  03/20/13 215 lb (97.523 kg)   Heparin Dosing Weight: ~90 kg  Vital Signs: Temp: 98.1 F (36.7 C) (12/29 0800) Temp src: Oral (12/29 0800) BP: 120/86 mmHg (12/29 0900) Pulse Rate: 85 (12/29 0900)  Labs:  Recent Labs  12/26/13 1830 12/26/13 2226 12/27/13 0325 12/27/13 0950  HGB 15.9  --  15.1  --   HCT 45.6  --  43.3  --   PLT 218  --  209  --   APTT 34  --   --   --   LABPROT 12.7  --   --   --   INR 0.97  --   --   --   HEPARINUNFRC  --   --  0.19* 0.34  CREATININE 1.04  --  0.96  --   TROPONINI  --  10.37* 17.37*  --     Estimated Creatinine Clearance: 96.8 ml/min (by C-G formula based on Cr of 0.96).   Medical History: Past Medical History  Diagnosis Date  . Hemorrhoids   . Dextrocardia     Medications:  . sodium chloride 1 mL/kg/hr (12/27/13 0845)  . heparin 1,450 Units/hr (12/27/13 0415)     Assessment: 57 year old male presenting with chest pain to begin anticoagulation with Heparin.  No anticoagulants noted in his history.  Heparin level now in goal range  Planning to go to the cath lab later today.  Goal of Therapy:  Heparin level 0.3-0.7 units/ml Monitor platelets by anticoagulation protocol: Yes   Plan:  1. Cont heparin gtt at 1450 units/hr. 2. F/u p cath.  Blanchie Dessert D Clinical Pharmacist Pager [phone]  12/27/2013 10:59 AM

## 2013-12-27 NOTE — Consult Note (Signed)
[address]       [place] [address]             [phone]       Reason for Consult: Severe multi-vessel coronary artery disease s/p NSTEMI Referring Physician: Dr. Elaysia Devargas Roberts  Jason Roberts is an 57 y.o. male.  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 It was initially called in as a STEMI by EMS but in the ER was not felt to meet STEMI criteria. Initial Troponin i poc was 1.90. He was admitted and had no further chest pain. Troponin has been rising to 17 today. Cath this afternoon shows severe multi-vessel CAD as noted below with a completely occluded mid RCA with collaterals from the left.  Past Medical History  Diagnosis Date  . Hemorrhoids   . Dextrocardia     Past Surgical History  Procedure Laterality Date  . Lll lung  1977    due to empyema  . Laminectomy    . Left arm chain saw injury      Family History  Problem Relation Age of Onset  . Diabetes Mother   . Hypertension Mother   . Stroke Father     Social History:  reports that he has been smoking Cigarettes.  He has been smoking about 1.00 pack per day. He does not have any smokeless tobacco history on file. He reports that he does not drink alcohol or use illicit drugs.  Allergies:  Allergies  Allergen Reactions  . Penicillins Swelling    Medications:  I have reviewed the patient's current medications. Prior to Admission:  Prescriptions prior to admission  Medication Sig Dispense Refill  . ibuprofen (ADVIL,MOTRIN) 200 MG tablet Take 200 mg by mouth every 6 (six) hours as needed for mild pain.      Marland Kitchen levothyroxine (SYNTHROID, LEVOTHROID) 50 MCG tablet Take 50 mcg by mouth daily before breakfast.      . traZODone (DESYREL) 50 MG  tablet Take 150 mg by mouth at bedtime.       Scheduled: . aspirin EC  325 mg Oral Daily  . atorvastatin  80 mg Oral q1800  . [START ON 12/28/2013] influenza vac split quadrivalent PF  0.5 mL Intramuscular Tomorrow-1000  . [START ON 12/28/2013] levothyroxine  50 mcg Oral QAC breakfast  . metoprolol tartrate  12.5 mg Oral BID  . nicotine  21 mg Transdermal Daily  . traZODone  150 mg Oral QHS   Continuous: . sodium chloride    . heparin 1,450 Units/hr (12/27/13 1400)   ION:[id number], morphine injection, nitroGLYCERIN, ondansetron (ZOFRAN) IV, zolpidem Anti-infectives   None      Results for orders placed during the hospital encounter of 12/26/13 (from the past 48 hour(s))  CBC     Status: Abnormal   Collection Time    12/26/13  6:30 PM      Result Value Range   WBC 11.0 (*) 4.0 - 10.5 K/uL   RBC 4.70  4.22 - 5.81 MIL/uL   Hemoglobin 15.9  13.0 - 17.0 g/dL   HCT 10.2  72.5 - 36.6 %   MCV 97.0  78.0 - 100.0 fL   MCH 33.8  26.0 -  34.0 pg   MCHC 34.9  30.0 - 36.0 g/dL   RDW 16.1  09.6 - 04.5 %   Platelets 218  150 - 400 K/uL  DIFFERENTIAL     Status: None   Collection Time    12/26/13  6:30 PM      Result Value Range   Neutrophils Relative % 60  43 - 77 %   Neutro Abs 6.6  1.7 - 7.7 K/uL   Lymphocytes Relative 30  12 - 46 %   Lymphs Abs 3.3  0.7 - 4.0 K/uL   Monocytes Relative 9  3 - 12 %   Monocytes Absolute 1.0  0.1 - 1.0 K/uL   Eosinophils Relative 1  0 - 5 %   Eosinophils Absolute 0.1  0.0 - 0.7 K/uL   Basophils Relative 0  0 - 1 %   Basophils Absolute 0.0  0.0 - 0.1 K/uL  PROTIME-INR     Status: None   Collection Time    12/26/13  6:30 PM      Result Value Range   Prothrombin Time 12.7  11.6 - 15.2 seconds   INR 0.97  0.00 - 1.49  APTT     Status: None   Collection Time    12/26/13  6:30 PM      Result Value Range   aPTT 34  24 - 37 seconds  BASIC METABOLIC PANEL     Status: Abnormal   Collection Time    12/26/13  6:30 PM      Result Value Range    Sodium 138  135 - 145 mEq/L   Potassium 4.2  3.5 - 5.1 mEq/L   Chloride 101  96 - 112 mEq/L   CO2 28  19 - 32 mEq/L   Glucose, Bld 108 (*) 70 - 99 mg/dL   BUN 18  6 - 23 mg/dL   Creatinine, Ser 4.09  0.50 - 1.35 mg/dL   Calcium 9.3  8.4 - 81.1 mg/dL   GFR calc non Af Amer 77 (*) >90 mL/min   GFR calc Af Amer 90 (*) >90 mL/min   Comment: (NOTE)     The eGFR has been calculated using the CKD EPI equation.     This calculation has not been validated in all clinical situations.     eGFR's persistently <90 mL/min signify possible Chronic Kidney     Disease.  TSH     Status: None   Collection Time    12/26/13  6:30 PM      Result Value Range   TSH 3.561  0.350 - 4.500 uIU/mL   Comment: Performed at [facility]  MAGNESIUM     Status: None   Collection Time    12/26/13  6:30 PM      Result Value Range   Magnesium 1.9  1.5 - 2.5 mg/dL  HEMOGLOBIN B1Y     Status: Abnormal   Collection Time    12/26/13  6:30 PM      Result Value Range   Hemoglobin A1C 5.9 (*) <5.7 %   Comment: (NOTE)  According to the ADA Clinical Practice Recommendations for 2011, when     HbA1c is used as a screening test:      >=6.5%   Diagnostic of Diabetes Mellitus               (if abnormal result is confirmed)     5.7-6.4%   Increased risk of developing Diabetes Mellitus     References:Diagnosis and Classification of Diabetes Mellitus,Diabetes     Care,2011,34(Suppl 1):S62-S69 and Standards of Medical Care in             Diabetes - 2011,Diabetes Care,2011,34 (Suppl 1):S11-S61.   Mean Plasma Glucose 123 (*) <117 mg/dL   Comment: Performed at [facility]  POCT I-STAT TROPONIN I     Status: Abnormal   Collection Time    12/26/13  6:37 PM      Result Value Range   Troponin i, poc 1.90 (*) 0.00 - 0.08 ng/mL   Comment NOTIFIED PHYSICIAN     Comment 3            Comment: Due to the release kinetics of cTnI,     a  negative result within the first hours     of the onset of symptoms does not rule out     myocardial infarction with certainty.     If myocardial infarction is still suspected,     repeat the test at appropriate intervals.  MRSA PCR SCREENING     Status: None   Collection Time    12/26/13  8:59 PM      Result Value Range   MRSA by PCR NEGATIVE  NEGATIVE   Comment:            The GeneXpert MRSA Assay (FDA     approved for NASAL specimens     only), is one component of a     comprehensive MRSA colonization     surveillance program. It is not     intended to diagnose MRSA     infection nor to guide or     monitor treatment for     MRSA infections.  TROPONIN I     Status: Abnormal   Collection Time    12/26/13 10:26 PM      Result Value Range   Troponin I 10.37 (*) <0.30 ng/mL   Comment:            Due to the release kinetics of cTnI,     a negative result within the first hours     of the onset of symptoms does not rule out     myocardial infarction with certainty.     If myocardial infarction is still suspected,     repeat the test at appropriate intervals.     CRITICAL RESULT CALLED TO, READ BACK BY AND VERIFIED WITH:     OUSTERMAN R,RN 12/26/13 2322 WAYK  PRO B NATRIURETIC PEPTIDE     Status: Abnormal   Collection Time    12/26/13 10:26 PM      Result Value Range   Pro B Natriuretic peptide (BNP) 774.3 (*) 0 - 125 pg/mL  HEPARIN LEVEL (UNFRACTIONATED)     Status: Abnormal   Collection Time    12/27/13  3:25 AM      Result Value Range   Heparin Unfractionated 0.19 (*) 0.30 - 0.70 IU/mL   Comment:            IF HEPARIN RESULTS ARE BELOW  EXPECTED VALUES, AND PATIENT     DOSAGE HAS BEEN CONFIRMED,     SUGGEST FOLLOW UP TESTING     OF ANTITHROMBIN III LEVELS.  CBC     Status: None   Collection Time    12/27/13  3:25 AM      Result Value Range   WBC 9.6  4.0 - 10.5 K/uL   RBC 4.47  4.22 - 5.81 MIL/uL   Hemoglobin 15.1  13.0 - 17.0 g/dL   HCT 40.9  81.1 - 91.4 %     MCV 96.9  78.0 - 100.0 fL   MCH 33.8  26.0 - 34.0 pg   MCHC 34.9  30.0 - 36.0 g/dL   RDW 78.2  95.6 - 21.3 %   Platelets 209  150 - 400 K/uL  TROPONIN I     Status: Abnormal   Collection Time    12/27/13  3:25 AM      Result Value Range   Troponin I 17.37 (*) <0.30 ng/mL   Comment:            Due to the release kinetics of cTnI,     a negative result within the first hours     of the onset of symptoms does not rule out     myocardial infarction with certainty.     If myocardial infarction is still suspected,     repeat the test at appropriate intervals.     CRITICAL VALUE NOTED.  VALUE IS CONSISTENT WITH PREVIOUSLY REPORTED AND CALLED VALUE.  LIPID PANEL     Status: Abnormal   Collection Time    12/27/13  3:25 AM      Result Value Range   Cholesterol 201 (*) 0 - 200 mg/dL   Triglycerides 086 (*) <150 mg/dL   HDL 39 (*) >57 mg/dL   Total CHOL/HDL Ratio 5.2     VLDL 51 (*) 0 - 40 mg/dL   LDL Cholesterol 846 (*) 0 - 99 mg/dL   Comment:            Total Cholesterol/HDL:CHD Risk     Coronary Heart Disease Risk Table                         Men   Women      1/2 Average Risk   3.4   3.3      Average Risk       5.0   4.4      2 X Average Risk   9.6   7.1      3 X Average Risk  23.4   11.0                Use the calculated Patient Ratio     above and the CHD Risk Table     to determine the patient's CHD Risk.                ATP III CLASSIFICATION (LDL):      <100     mg/dL   Optimal      962-952  mg/dL   Near or Above                        Optimal      130-159  mg/dL   Borderline      841-324  mg/dL   High      >401     mg/dL  Very High  BASIC METABOLIC PANEL     Status: Abnormal   Collection Time    12/27/13  3:25 AM      Result Value Range   Sodium 139  135 - 145 mEq/L   Potassium 3.8  3.5 - 5.1 mEq/L   Chloride 102  96 - 112 mEq/L   CO2 28  19 - 32 mEq/L   Glucose, Bld 91  70 - 99 mg/dL   BUN 19  6 - 23 mg/dL   Creatinine, Ser 5.36  0.50 - 1.35 mg/dL    Calcium 8.9  8.4 - 64.4 mg/dL   GFR calc non Af Amer 90 (*) >90 mL/min   GFR calc Af Amer >90  >90 mL/min   Comment: (NOTE)     The eGFR has been calculated using the CKD EPI equation.     This calculation has not been validated in all clinical situations.     eGFR's persistently <90 mL/min signify possible Chronic Kidney     Disease.  HEPARIN LEVEL (UNFRACTIONATED)     Status: None   Collection Time    12/27/13  9:50 AM      Result Value Range   Heparin Unfractionated 0.34  0.30 - 0.70 IU/mL   Comment:            IF HEPARIN RESULTS ARE BELOW     EXPECTED VALUES, AND PATIENT     DOSAGE HAS BEEN CONFIRMED,     SUGGEST FOLLOW UP TESTING     OF ANTITHROMBIN III LEVELS.  SURGICAL PCR SCREEN     Status: None   Collection Time    12/27/13  4:54 PM      Result Value Range   MRSA, PCR NEGATIVE  NEGATIVE   Staphylococcus aureus NEGATIVE  NEGATIVE   Comment:            The Xpert SA Assay (FDA     approved for NASAL specimens     in patients over 77 years of age),     is one component of     a comprehensive surveillance     program.  Test performance has     been validated by [employer] for patients greater     than or equal to 39 year old.     It is not intended     to diagnose infection nor to     guide or monitor treatment.    Dg Chest Portable 1 View  12/26/2013   CLINICAL DATA:  Chest pain  EXAM: PORTABLE CHEST - 1 VIEW  COMPARISON:  02/22/2013  FINDINGS: Postop changes left lung base with surgical clips in the left lower hilum and scarring in the left lung base, unchanged. Negative for mass lesion. Negative for pneumonia or heart failure.  IMPRESSION: Postop changes left lung base are stable. No superimposed acute abnormality.   Electronically Signed   By: Marlan Palau M.D.   On: 12/26/2013 19:15    Review of Systems  Constitutional: Negative.   HENT: Negative.   Eyes: Negative.   Respiratory: Positive for shortness of breath.   Cardiovascular: Positive for chest  pain. Negative for orthopnea, leg swelling and PND.  Gastrointestinal: Negative.   Genitourinary: Negative.   Musculoskeletal:       Arthritis  Skin: Negative.   Neurological: Negative.   Endo/Heme/Allergies: Negative.   Psychiatric/Behavioral: Negative.    Blood pressure 138/88, pulse 81, temperature 98.1 F (36.7 C), temperature source  Oral, resp. rate 16, height 5\' 10"  (1.778 m), weight 93 kg (205 lb 0.4 oz), SpO2 99.00%. Physical Exam  Constitutional: He is oriented to person, place, and time. He appears well-developed and well-nourished. No distress.  HENT:  Head: Normocephalic and atraumatic.  Mouth/Throat: Oropharynx is clear and moist.  Eyes: EOM are normal. Pupils are equal, round, and reactive to light.  Neck: Normal range of motion. Neck supple. No JVD present. No thyromegaly present.  Cardiovascular: Normal rate, regular rhythm, normal heart sounds and intact distal pulses.   No murmur heard. Respiratory: Effort normal and breath sounds normal. No respiratory distress.  Old left thoracotomy scar  GI: Soft. Bowel sounds are normal. He exhibits no distension and no mass. There is no tenderness.  Musculoskeletal: Normal range of motion. He exhibits no edema.  Lymphadenopathy:    He has no cervical adenopathy.  Neurological: He is alert and oriented to person, place, and time. He has normal strength. No cranial nerve deficit or sensory deficit.  Skin: Skin is warm and dry.  Psychiatric: He has a normal mood and affect.   CARDIAC CATHETERIZATION REPORT  NAME: ARTAVIUS STEARNS MRN: [id number]  DOB: Oct 12, 1956 ADMIT DATE: 12/26/2013  Procedure Date: 12/27/2013  INTERVENTIONAL CARDIOLOGIST: Marykay Lex, M.D., MS  PRIMARY CARE PROVIDER: Thora Lance, MD  PRIMARY CARDIOLOGIST: Marykay Lex, MD, MS  PATIENT: NERY FRAPPIER is a 57 y.o. male with Dextrocardia who presented with ACS - (cancelled STEMI) who ruled in for significant NSTEMI. Was pain free after initial  Rx & maintained no Heparin overnight. Confirmation with the patient's wife reveals that he has been having stuttering off and on chest pain for the last several weeks.  He is referred for Cardiac Catheterization +/- PCI.  PRE-OPERATIVE DIAGNOSIS:  NSTEMI  DEXTROCARDIA PROCEDURES PERFORMED:  LEFT HEART CATHETERIZATION WITH CORONARY ANGIOGRAPY PROCEDURE:Consent: Risks of procedure as well as the alternatives and risks of each were explained to the (patient/caregiver). Consent for procedure obtained.  Consent for signed by MD and patient with RN witness -- placed on chart.  PROCEDURE: The patient was brought to the 2nd Floor [facility] in the fasting state and prepped and draped in the usual sterile fashion for Right groin or radial access. A modified Allen's test with plethysmography was performed, revealing excellent Ulnar artery collateral flow. Sterile technique was used including antiseptics, cap, gloves, gown, hand hygiene, mask and sheet. Skin prep: Chlorhexidine.  Time Out: Verified patient identification, verified procedure, site/side was marked, verified correct patient position, special equipment/implants available, medications/allergies/relevent history reviewed, required imaging and test results available. Performed  Access: RIGHT RADIAL Artery; 6 Fr Sheath -- Seldinger technique (Angiocath Micropuncture Kit)  IA Radial Cocktail, IV Heparin Initial attempt at cannulating the radial artery with brisk Angiocath flow, resulted in unsuccessful attempt to access the artery. The sheath was inserted under fluoroscopic guidance, however no blood was aspirated. Therefore the sheath was removed, and manual pressure held. After adequate hemostasis was obtained, the renal artery was accessed and upstream site and successfully cannulated Diagnostic Left Heart Catheterization: 5 Fr TIG 4.0, and Angled Pigtail advanced and exchanged over long exchange safety J-wire  Right &  Left Coronary Artery Angiography: TIG 4.0  LV Hemodynamics (LV Gram): Angled pick TR Band: 1615 Hours, at mL air; nonocclusive hemostasis was assured with plethysmography  MEDICATIONS:  Anesthesia: Local Lidocaine 2 ml Sedation: 2 mg IV Versed, 50 mcg IV fentanyl ;  Premedication: 2 mg oral Valium Omnipaque Contrast: 90 ml Anticoagulation:  IV Heparin 4000 Units ;  Radial Cocktail: 5 mg Verapamil, 400 mcg NTG, 2 ml 2% Lidocaine in 10 ml NS Hemodynamics:  Central Aortic / Mean Pressures: 102/57 mmHg; 80 mmHg  Left Ventricular Pressures / EDP: 104/12 mmHg; 15 mmHg Left Ventriculography:  EF: 50-55 %  Wall Motion: Basal-mid inferior hypokinesis Coronary Anatomy:  Left Main: Large caliber vessel that trifurcates into the LAD, Circumflex, and small-caliber Ramus Intermedius; angiographically normal  LAD: Large-caliber vessel, that wraps the apex. There is a 80% relatively focal stenosis at the bifurcation point for the first major diagonal branch (D1) and a large septal perforator. Beyond the stenosis there is a large branching septal trunk, then the LAD tapers down around the apex giving rise to one smaller diagonal branch distally. There is brisk collateral flow from septal perforators and the distal LAD to the RPDA that fills retrograde to the Right Posterolateral branches.  D1: Moderate caliber vessel that takes off at the site of the focal 80% LAD lesion. The diagonal itself is relatively free of disease. Left Circumflex: Very large caliber vessel that gives off 2 moderate -caliber OM branches (OM 1 and OM 2) then bifurcates into the small caliber AV groove circumflex and a large OM 3. There is a small atrial branch that comes off in the site as well.  OM1 and 2: Moderate caliber vessels, OM 2 has a roughly 40% proximal stenosis and is a larger of the 2 branches.  OM 3: Moderate-large-caliber vessel with a proximal eccentric 90% stenosis Ramus intermedius: Small-caliber vessel with minimal  luminal irregularities.  RCA: Very large caliber vessel with an anterior takeoff. There is a large atrial branch followed by 2 RV marginal branches. The vessel is then is 100% thrombotic occluded with no distal flow beyond the mid vessel.  Collateral flow from the LAD fills the RPDA with retrograde flow to the Right Posterior AV Groove Branch (RPAV) both of which are relatively free of disease. There is no retrograde flow up the native RCA to the occlusion site. PATIENT DISPOSITION:  The patient was transferred to the PACU holding area in a hemodynamicaly stable, chest pain free condition.  The patient tolerated the procedure well, and there were no complications. EBL: < 10 ml  The patient was stable before, during, and after the procedure.  There is a small hematoma at the cath insertion site. POST-OPERATIVE DIAGNOSIS:  Severe multivessel disease with 100% RCA occlusion, 90% OM 3, 80% bifurcation LAD-D1.  Relatively well-preserved LV function with expected inferior hypokinesis. PLAN OF CARE:  Transferred to CCU/TCU for post catheterization care given the extent of CAD  Standard post radial cath care with a TR band.  CV TS has been consulted for CABG consultation  Restart Heparin 6 hours post TR band removal  Continue aspirin and statin as well as low-dose beta blocker HARDING,DAVID W, M.D., M.S.  [facility]  [address]  [place], [place] [address]  [phone]   *[facility]* *[facility]* [address] [place], [place] [address] [phone]  ------------------------------------------------------------ Transthoracic Echocardiography  Patient: Jason Roberts, Jason Roberts MR #: [id number] Study Date: 12/27/2013 Gender: M Age: 84 Height: 180.3cm Weight: 91.4kg BSA: 2.31m^2 Pt. Status: Room: 2C13C  ADMITTING Nicki Guadalajara ATTENDING Nicki Guadalajara PERFORMING [facility]vc SONOGRAPHER Dewitt Hoes, RDCS ORDERING Leeann Must Faylene Kurtz cc:  ------------------------------------------------------------ LV EF: 55% - 60%  ------------------------------------------------------------ Indications: Chest pain 786.51.  ------------------------------------------------------------ History: Risk factors: Dextrocardia. Current tobacco use. Dyslipidemia.  ------------------------------------------------------------  Study Conclusions  - Technically difficult at apical window because his windowwas on his sternum so pictures were not good at all. No other good windows for pictures. - Status, risk factors: Dextrocardia. - Left ventricle: The cavity size was normal. Wall thickness was increased in a pattern of mild LVH. Systolic function was normal. The estimated ejection fraction was in the range of 55% to 60%. Doppler parameters are consistent with abnormal left ventricular relaxation (grade 1 diastolic dysfunction). The E/e' ratio is <10, suggesting normal LV filling pressure. - Left atrium: The atrium was normal in size. - Inferior vena cava: The vessel was normal in size; the respirophasic diameter changes were in the normal range (= 50%); findings are consistent with normal central venous pressure. Transthoracic echocardiography. M-mode, complete 2D, spectral Doppler, and color Doppler. Height: Height: 180.3cm. Height: 71in. Weight: Weight: 91.4kg. Weight: 201lb. Body mass index: BMI: 28.1kg/m^2. Body surface area: BSA: 2.19m^2. Blood pressure: 118/79. Patient status: Inpatient. Location: ICU/CCU  ------------------------------------------------------------  ------------------------------------------------------------ Left ventricle: The cavity size was normal. Wall thickness was increased in a pattern of mild LVH. Systolic function was normal. The estimated ejection fraction was in the range of 55% to 60%. Images were inadequate for LV wall motion assessment. Doppler parameters are consistent with  abnormal left ventricular relaxation (grade 1 diastolic dysfunction). The E/e' ratio is <10, suggesting normal LV filling pressure.  ------------------------------------------------------------ Aortic valve: Structurally normal valve. Trileaflet. Cusp separation was normal. Doppler: Transvalvular velocity was within the normal range. There was no stenosis. No regurgitation.  ------------------------------------------------------------ Aorta: Aortic root: The aortic root was normal in size. Ascending aorta: The ascending aorta was normal in size.  ------------------------------------------------------------ Mitral valve: Poorly visualized. Doppler: No significant regurgitation. Valve area by pressure half-time: 2.69cm^2. Indexed valve area by pressure half-time: 1.25cm^2/m^2.  ------------------------------------------------------------ Left atrium: The atrium was normal in size.  ------------------------------------------------------------ Atrial septum: Poorly visualized.  ------------------------------------------------------------ Right ventricle: Poorly visualized.  ------------------------------------------------------------ Pulmonic valve: Poorly visualized. Doppler: No significant regurgitation.  ------------------------------------------------------------ Tricuspid valve: Poorly visualized. Doppler: No significant regurgitation.  ------------------------------------------------------------ Pulmonary artery: Poorly visualized.  ------------------------------------------------------------ Right atrium: The atrium was normal in size.  ------------------------------------------------------------ Pericardium: There was no pericardial effusion.  ------------------------------------------------------------ Systemic veins: Inferior vena cava: The vessel was normal in size; the respirophasic diameter changes were in the normal range (= 50%); findings are consistent  with normal central venous pressure.  ------------------------------------------------------------  2D measurements Normal Doppler measurements Norma Left ventricle l LVID ED, 49.5 mm 43-52 Mitral valve chord, Peak E 46. cm/s ----- PLAX vel 6 LVID ES, 37.3 mm 23-38 Peak A 67. cm/s ----- chord, vel 9 PLAX Pressure 82 ms ----- FS, chord, 25 % >29 half-time PLAX Peak E/A 0.6 ----- LVPW, ED 12.5 mm ------ ratio 9 IVS/LVPW 1.12 <1.3 Area 2.6 cm^2 ----- ratio, ED (PHT) 9 Ventricular septum Area 1.2 cm^2/m^2 ----- IVS, ED 14 mm ------ index 5 Aorta (PHT) Root diam 38 mm ------ Left atrium AP dim 37 mm ------ AP dim 1.72 cm/m^2 <2.2 index  ------------------------------------------------------------ Prepared and Electronically Authenticated by  Zoila Shutter 2014-12-29T12:34:[id number]    Assessment/Plan:  He has severe multi-vessel coronary disease with a significant NSTEMI due to RCA occlusion. I agree that CABG is the best treatment for this patient. He has dextrocardia which may make exposure more difficult and require cannulation of the femoral vessels for CPB. I discussed the operative procedure with the patient and his daughter who is a nurse including alternatives, benefits and risks; including but not limited to bleeding, blood transfusion, infection,  stroke, myocardial infarction, graft failure, heart block requiring a permanent pacemaker, organ dysfunction, and death.  Renelda Loma understands and agrees to proceed.  We will schedule surgery for Wednesday.  Alleen Borne 12/27/2013, 6:57 PM

## 2013-12-28 ENCOUNTER — Inpatient Hospital Stay (HOSPITAL_COMMUNITY): Payer: PRIVATE HEALTH INSURANCE

## 2013-12-28 ENCOUNTER — Encounter (HOSPITAL_COMMUNITY): Payer: Self-pay | Admitting: Certified Registered Nurse Anesthetist

## 2013-12-28 DIAGNOSIS — I2510 Atherosclerotic heart disease of native coronary artery without angina pectoris: Secondary | ICD-10-CM

## 2013-12-28 DIAGNOSIS — F17200 Nicotine dependence, unspecified, uncomplicated: Secondary | ICD-10-CM

## 2013-12-28 DIAGNOSIS — Q248 Other specified congenital malformations of heart: Secondary | ICD-10-CM

## 2013-12-28 DIAGNOSIS — E785 Hyperlipidemia, unspecified: Secondary | ICD-10-CM

## 2013-12-28 DIAGNOSIS — Z951 Presence of aortocoronary bypass graft: Secondary | ICD-10-CM

## 2013-12-28 DIAGNOSIS — Z01810 Encounter for preprocedural cardiovascular examination: Secondary | ICD-10-CM

## 2013-12-28 HISTORY — DX: Presence of aortocoronary bypass graft: Z95.1

## 2013-12-28 HISTORY — PX: CARDIAC CATHETERIZATION: SHX172

## 2013-12-28 LAB — CBC
HCT: 42.8 % (ref 39.0–52.0)
Hemoglobin: 14.6 g/dL (ref 13.0–17.0)
MCH: 33.3 pg (ref 26.0–34.0)
MCHC: 34.1 g/dL (ref 30.0–36.0)
MCV: 97.5 fL (ref 78.0–100.0)
Platelets: 207 10*3/uL (ref 150–400)
RBC: 4.39 MIL/uL (ref 4.22–5.81)
RDW: 12.8 % (ref 11.5–15.5)

## 2013-12-28 LAB — PULMONARY FUNCTION TEST
FEF 25-75 Pre: 1.94 L/sec
FEF2575-%Change-Post: 45 %
FEF2575-%Pred-Pre: 63 %
FEV1-%Change-Post: 16 %
FEV1-%Pred-Post: 97 %
FEV1-%Pred-Pre: 83 %
FEV1-Post: 3.55 L
FEV1FVC-%Change-Post: 4 %
FEV6-%Change-Post: 9 %
FEV6-%Pred-Post: 116 %
FEV6-%Pred-Pre: 105 %
FEV6-Pre: 4.86 L
FEV6FVC-%Pred-Post: 104 %
FVC-%Pred-Post: 112 %
FVC-Pre: 4.88 L
Post FEV1/FVC ratio: 66 %
Post FEV6/FVC ratio: 99 %
Pre FEV6/FVC Ratio: 100 %

## 2013-12-28 LAB — HEPARIN LEVEL (UNFRACTIONATED): Heparin Unfractionated: 0.24 [IU]/mL — ABNORMAL LOW (ref 0.30–0.70)

## 2013-12-28 LAB — TYPE AND SCREEN: Antibody Screen: NEGATIVE

## 2013-12-28 LAB — ABO/RH: ABO/RH(D): O POS

## 2013-12-28 MED ORDER — LEVOFLOXACIN IN D5W 500 MG/100ML IV SOLN
500.0000 mg | INTRAVENOUS | Status: AC
  Administered 2013-12-29: 500 mg via INTRAVENOUS
  Filled 2013-12-28: qty 100

## 2013-12-28 MED ORDER — POTASSIUM CHLORIDE 2 MEQ/ML IV SOLN
80.0000 meq | INTRAVENOUS | Status: DC
  Filled 2013-12-28: qty 40

## 2013-12-28 MED ORDER — METOPROLOL TARTRATE 12.5 MG HALF TABLET
12.5000 mg | ORAL_TABLET | Freq: Once | ORAL | Status: AC
  Administered 2013-12-29: 12.5 mg via ORAL
  Filled 2013-12-28: qty 1

## 2013-12-28 MED ORDER — TEMAZEPAM 15 MG PO CAPS: 15.0000 mg | ORAL_CAPSULE | Freq: Once | ORAL | Status: AC | PRN

## 2013-12-28 MED ORDER — EPINEPHRINE HCL 1 MG/ML IJ SOLN
0.5000 ug/min | INTRAVENOUS | Status: DC
  Filled 2013-12-28: qty 4

## 2013-12-28 MED ORDER — DIAZEPAM 5 MG PO TABS
10.0000 mg | ORAL_TABLET | Freq: Once | ORAL | Status: AC
  Administered 2013-12-29: 10 mg via ORAL
  Filled 2013-12-28: qty 2

## 2013-12-28 MED ORDER — SODIUM CHLORIDE 0.9 % IV SOLN
INTRAVENOUS | Status: AC
  Administered 2013-12-29: 2.2 [IU]/h via INTRAVENOUS
  Filled 2013-12-28: qty 1

## 2013-12-28 MED ORDER — BISACODYL 5 MG PO TBEC
5.0000 mg | DELAYED_RELEASE_TABLET | Freq: Once | ORAL | Status: AC
  Administered 2013-12-28: 5 mg via ORAL
  Filled 2013-12-28: qty 1

## 2013-12-28 MED ORDER — HEPARIN (PORCINE) IN NACL 100-0.45 UNIT/ML-% IJ SOLN
1650.0000 [IU]/h | INTRAMUSCULAR | Status: DC
  Administered 2013-12-28 (×2): 1650 [IU]/h via INTRAVENOUS
  Filled 2013-12-28 (×3): qty 250

## 2013-12-28 MED ORDER — DEXMEDETOMIDINE HCL IN NACL 400 MCG/100ML IV SOLN
0.1000 ug/kg/h | INTRAVENOUS | Status: AC
  Administered 2013-12-29: 0.3 ug/kg/h via INTRAVENOUS
  Filled 2013-12-28: qty 100

## 2013-12-28 MED ORDER — DOPAMINE-DEXTROSE 3.2-5 MG/ML-% IV SOLN
2.0000 ug/kg/min | INTRAVENOUS | Status: DC
  Filled 2013-12-28: qty 250

## 2013-12-28 MED ORDER — NITROGLYCERIN IN D5W 200-5 MCG/ML-% IV SOLN
2.0000 ug/min | INTRAVENOUS | Status: AC
  Administered 2013-12-29: 2 ug/min via INTRAVENOUS
  Filled 2013-12-28: qty 250

## 2013-12-28 MED ORDER — ALBUTEROL SULFATE (2.5 MG/3ML) 0.083% IN NEBU
2.5000 mg | INHALATION_SOLUTION | Freq: Once | RESPIRATORY_TRACT | Status: AC
  Administered 2013-12-28: 2.5 mg via RESPIRATORY_TRACT

## 2013-12-28 MED ORDER — VANCOMYCIN HCL 10 G IV SOLR
1500.0000 mg | INTRAVENOUS | Status: AC
  Administered 2013-12-29: 1500 mg via INTRAVENOUS
  Filled 2013-12-28: qty 1500

## 2013-12-28 MED ORDER — MAGNESIUM SULFATE 50 % IJ SOLN
40.0000 meq | INTRAMUSCULAR | Status: DC
  Filled 2013-12-28: qty 10

## 2013-12-28 MED ORDER — ALPRAZOLAM 0.25 MG PO TABS
0.2500 mg | ORAL_TABLET | ORAL | Status: DC | PRN
  Administered 2013-12-28: 0.5 mg via ORAL
  Filled 2013-12-28: qty 2

## 2013-12-28 MED ORDER — PHENYLEPHRINE HCL 10 MG/ML IJ SOLN
30.0000 ug/min | INTRAVENOUS | Status: AC
  Administered 2013-12-29: 50 ug/min via INTRAVENOUS
  Filled 2013-12-28: qty 2

## 2013-12-28 MED ORDER — SODIUM CHLORIDE 0.9 % IV SOLN
INTRAVENOUS | Status: DC
  Filled 2013-12-28: qty 30

## 2013-12-28 MED ORDER — PLASMA-LYTE 148 IV SOLN
INTRAVENOUS | Status: AC
  Administered 2013-12-29: 09:00:00
  Filled 2013-12-28: qty 2.5

## 2013-12-28 MED ORDER — SODIUM CHLORIDE 0.9 % IV SOLN
INTRAVENOUS | Status: AC
  Administered 2013-12-29: 69.8 mL/h via INTRAVENOUS
  Filled 2013-12-28 (×2): qty 40

## 2013-12-28 NOTE — ED Provider Notes (Signed)
I have personally seen and examined the patient.  I have discussed the plan of care with the resident.  I have reviewed the documentation on PMH/FH/Soc. History.  I have reviewed the documentation of the resident and agree.  I have reviewed and agree with the ECG interpretation(s) documented by the resident.  CRITICAL CARE Performed by: Joya Gaskins Total critical care time: 33 Critical care time was exclusive of separately billable procedures and treating other patients. Critical care was necessary to treat or prevent imminent or life-threatening deterioration. Critical care was time spent personally by me on the following activities: development of treatment plan with patient and/or surrogate as well as nursing, discussions with consultants, evaluation of patient's response to treatment, examination of patient, obtaining history from patient or surrogate, ordering and performing treatments and interventions, ordering and review of laboratory studies, ordering and review of radiographic studies, pulse oximetry and re-evaluation of patient's condition.   Joya Gaskins, MD 12/28/13 [phone]

## 2013-12-28 NOTE — Progress Notes (Signed)
CARDIAC REHAB PHASE I   PRE:  Rate/Rhythm: 84 SR    BP: sitting 122/84    SaO2: 99 RA  MODE:  Ambulation: 700 ft   POST:  Rate/Rhythm: 90 SR    BP: sitting 121/81     SaO2:   Pt thankful to get out of bed and walk. Easy pace, no CP, sx. Felt good. Pre-op ed completed including OHS book and instructions for video. Discussed smoking cessation with pt and family members. Pt motivated to quit smoking. Will f/u after surg. Pt wants to walk more tonight. [phone]   Elissa Lovett [place]lake CES, ACSM 12/28/2013 2:50 PM

## 2013-12-28 NOTE — Progress Notes (Addendum)
VASCULAR LAB PRELIMINARY  PRELIMINARY  PRELIMINARY  PRELIMINARY  Pre-op Cardiac Surgery  Carotid Findings:  Bilateral:  1-39% ICA stenosis lower end of scale  Vertebral artery flow is antegrade.     Upper Extremity Right Left  Brachial Pressures 131 Triphasic 105 triphasic  Radial Waveforms Triphasic Triphasic  Ulnar Waveforms Triphasic Triphasic  Palmar Arch (Allen's Test) Normal Abnormal   Findings:  Doppler waveforms remained normal with both radial and ulnar compressions on the right. The left Doppler waveforms obliterated with radial compression and remained normal with ulnar compression.    Lower  Extremity Right Left  Dorsalis Pedis    Anterior Tibial    Posterior Tibial    Ankle/Brachial Indices      Findings:  Pedal pulses are palpable bilaterally   Cecia Egge, RVS 12/28/2013, 1:32 PM

## 2013-12-28 NOTE — Progress Notes (Signed)
1 Day Post-Op Procedure(s) (LRB): LEFT HEART CATHETERIZATION WITH CORONARY ANGIOGRAM (N/A) Subjective: No chest pain or dyspnea  Objective: Vital signs in last 24 hours: Temp:  [98.1 F (36.7 C)-98.5 F (36.9 C)] 98.2 F (36.8 C) (12/30 1118) Pulse Rate:  [80-82] 82 (12/30 1118) Cardiac Rhythm:  [-] Normal sinus rhythm (12/30 0400) Resp:  [13-27] 24 (12/30 1118) BP: (110-138)/(71-96) 110/73 mmHg (12/30 1118) SpO2:  [97 %-100 %] 100 % (12/30 1118) Weight:  [91.3 kg (201 lb 4.5 oz)-93 kg (205 lb 0.4 oz)] 91.3 kg (201 lb 4.5 oz) (12/30 0500)  Hemodynamic parameters for last 24 hours:    Intake/Output from previous day: 12/29 0701 - 12/30 0700 In: 1051.5 [P.O.:480; I.V.:571.5] Out: 750 [Urine:750] Intake/Output this shift: Total I/O In: 754.5 [P.O.:600; I.V.:154.5] Out: 1000 [Urine:1000]  General appearance: alert and cooperative Heart: regular rate and rhythm, S1, S2 normal, no murmur, click, rub or gallop Lungs: clear to auscultation bilaterally  Lab Results:  Recent Labs  12/27/13 0325 12/28/13 0450  WBC 9.6 10.1  HGB 15.1 14.6  HCT 43.3 42.8  PLT 209 207   BMET:  Recent Labs  12/26/13 1830 12/27/13 0325  NA 138 139  K 4.2 3.8  CL 101 102  CO2 28 28  GLUCOSE 108* 91  BUN 18 19  CREATININE 1.04 0.96  CALCIUM 9.3 8.9    PT/INR:  Recent Labs  12/26/13 1830  LABPROT 12.7  INR 0.97   ABG No results found for this basename: phart, pco2, po2, hco3, tco2, acidbasedef, o2sat   CBG (last 3)  No results found for this basename: GLUCAP,  in the last 72 hours  Assessment/Plan: S/P Procedure(s) (LRB): LEFT HEART CATHETERIZATION WITH CORONARY ANGIOGRAM (N/A) Severe multi-vessel CAD with dextrocardia Plan CABG in the am. I discussed the operative procedure with the patient and his wife including alternatives, benefits and risks; including but not limited to bleeding, blood transfusion, infection, stroke, myocardial infarction, graft failure, heart block  requiring a permanent pacemaker, organ dysfunction, and death.  Renelda Loma understands and agrees to proceed.     LOS: 2 days    Kjerstin Abrigo K 12/28/2013

## 2013-12-28 NOTE — Progress Notes (Signed)
ANTICOAGULATION CONSULT NOTE - Follow Up Consult  Pharmacy Consult:  Heparin Indication: chest pain/ACS  Allergies  Allergen Reactions  . Penicillins Swelling    Patient Measurements: Height: 5\' 10"  (177.8 cm) Weight: 201 lb 4.5 oz (91.3 kg) IBW/kg (Calculated) : 73 Heparin Dosing Weight: 90 kg  Vital Signs: Temp: 98.3 F (36.8 C) (12/30 0821) Temp src: Oral (12/30 0821) BP: 110/73 mmHg (12/30 0821) Pulse Rate: 80 (12/30 0821)  Labs:  Recent Labs  12/26/13 1830 12/26/13 2226 12/27/13 0325 12/27/13 0950 12/28/13 0450 12/28/13 0820  HGB 15.9  --  15.1  --  14.6  --   HCT 45.6  --  43.3  --  42.8  --   PLT 218  --  209  --  207  --   APTT 34  --   --   --   --   --   LABPROT 12.7  --   --   --   --   --   INR 0.97  --   --   --   --   --   HEPARINUNFRC  --   --  0.19* 0.34  --  0.24*  CREATININE 1.04  --  0.96  --   --   --   TROPONINI  --  10.37* 17.37*  --   --   --     Estimated Creatinine Clearance: 95.3 ml/min (by C-G formula based on Cr of 0.96).   Assessment: 72 YOM admitted with chest pain and started on IV heparin.  He is s/p cath 12/29 which showed severe multi-vessel CAD and to proceed with CABG on Wednesday.  Heparin resumed post cath and heparin level is below goal  (HL= 0.24) on 1500 units/hr.  Goal of Therapy:  Heparin level 0.3-0.7 units/ml Monitor platelets by anticoagulation protocol: Yes    Plan:  -Increase heparin to 1650 units/hr -Heparin level with CBC in am  Harland German, Pharm D 12/28/2013 11:02 AM

## 2013-12-28 NOTE — Progress Notes (Signed)
Subjective: No complaints. CP free. Denies SOB.   Objective: Vital signs in last 24 hours: Temp:  [98.1 F (36.7 C)-98.5 F (36.9 C)] 98.1 F (36.7 C) (12/30 0352) Pulse Rate:  [65-85] 65 (12/29 1546) Resp:  [11-27] 19 (12/30 0400) BP: (107-138)/(61-96) 129/76 mmHg (12/30 0400) SpO2:  [94 %-99 %] 97 % (12/30 0400) Weight:  [91.128 kg (200 lb 14.4 oz)-93 kg (205 lb 0.4 oz)] 91.3 kg (201 lb 4.5 oz) (12/30 0500) Last BM Date: 12/27/13  Intake/Output from previous day: 12/29 0701 - 12/30 0700 In: 1051.5 [P.O.:480; I.V.:571.5] Out: 750 [Urine:750] Intake/Output this shift:    Medications Current Facility-Administered Medications  Medication Dose Route Frequency Provider Last Rate Last Dose  . 0.9 %  sodium chloride infusion   Intravenous Continuous Lennette Bihari, MD 10 mL/hr at 12/27/13 2100    . acetaminophen (TYLENOL) tablet 650 mg  650 mg Oral Q4H PRN Leeann Must, MD   650 mg at 12/27/13 1942  . aspirin EC tablet 325 mg  325 mg Oral Daily Marykay Lex, MD   325 mg at 12/27/13 1736  . atorvastatin (LIPITOR) tablet 80 mg  80 mg Oral q1800 Leeann Must, MD   80 mg at 12/27/13 1814  . heparin ADULT infusion 100 units/mL (25000 units/250 mL)  1,500 Units/hr Intravenous Continuous Lennon Alstrom, [facility] 15 mL/hr at 12/28/13 0030 1,500 Units/hr at 12/28/13 0030  . influenza vac split quadrivalent PF (FLUARIX) injection 0.5 mL  0.5 mL Intramuscular Tomorrow-1000 Lennette Bihari, MD      . levothyroxine (SYNTHROID, LEVOTHROID) tablet 50 mcg  50 mcg Oral QAC breakfast Marykay Lex, MD      . metoprolol tartrate (LOPRESSOR) tablet 12.5 mg  12.5 mg Oral BID Leeann Must, MD   12.5 mg at 12/27/13 2137  . morphine 2 MG/ML injection 2 mg  2 mg Intravenous Q1H PRN Marykay Lex, MD   2 mg at 12/27/13 1736  . nicotine (NICODERM CQ - dosed in mg/24 hours) patch 21 mg  21 mg Transdermal Daily Leeann Must, MD   21 mg at 12/27/13 0919  . nitroGLYCERIN (NITROSTAT) SL tablet 0.4 mg  0.4 mg  Sublingual Q5 Min x 3 PRN Leeann Must, MD      . ondansetron [facility]) injection 4 mg  4 mg Intravenous Q6H PRN Leeann Must, MD      . traZODone (DESYREL) tablet 150 mg  150 mg Oral QHS Marykay Lex, MD   150 mg at 12/27/13 2137  . zolpidem (AMBIEN) tablet 5 mg  5 mg Oral QHS PRN Leeann Must, MD   5 mg at 12/27/13 2140    PE: General appearance: alert, cooperative and no distress Lungs: clear to auscultation bilaterally Heart: regular rate and rhythm Extremities: no LEE Pulses: weak right radial pulse, 2+ left radial, 2+ DPs Skin: warm and dry Neurologic: Grossly normal  Lab Results:   Recent Labs  12/26/13 1830 12/27/13 0325 12/28/13 0450  WBC 11.0* 9.6 10.1  HGB 15.9 15.1 14.6  HCT 45.6 43.3 42.8  PLT 218 209 207   BMET  Recent Labs  12/26/13 1830 12/27/13 0325  NA 138 139  K 4.2 3.8  CL 101 102  CO2 28 28  GLUCOSE 108* 91  BUN 18 19  CREATININE 1.04 0.96  CALCIUM 9.3 8.9   PT/INR  Recent Labs  12/26/13 1830  LABPROT 12.7  INR 0.97   Cholesterol  Recent Labs  12/27/13 0325  CHOL 201*  Cardiac Panel (last 3 results)  Recent Labs  12/26/13 2226 12/27/13 0325  TROPONINI 10.37* 17.37*    Studies/Results:  LHC 12/27/13 Hemodynamics:  Central Aortic / Mean Pressures: 102/57 mmHg; 80 mmHg  Left Ventricular Pressures / EDP: 104/12 mmHg; 15 mmHg Left Ventriculography:  EF: 50-55 %  Wall Motion: Basal-mid inferior hypokinesis Coronary Anatomy:  Left Main: Large caliber vessel that trifurcates into the LAD, Circumflex, and small-caliber Ramus Intermedius; angiographically normal  LAD: Large-caliber vessel, that wraps the apex. There is a 80% relatively focal stenosis at the bifurcation point for the first major diagonal branch (D1) and a large septal perforator. Beyond the stenosis there is a large branching septal trunk, then the LAD tapers down around the apex giving rise to one smaller diagonal branch distally. There is brisk collateral flow  from septal perforators and the distal LAD to the RPDA that fills retrograde to the Right Posterolateral branches.  D1: Moderate caliber vessel that takes off at the site of the focal 80% LAD lesion. The diagonal itself is relatively free of disease. Left Circumflex: Very large caliber vessel that gives off 2 moderate -caliber OM branches (OM 1 and OM 2) then bifurcates into the small caliber AV groove circumflex and a large OM 3. There is a small atrial branch that comes off in the site as well.  OM1 and 2: Moderate caliber vessels, OM 2 has a roughly 40% proximal stenosis and is a larger of the 2 branches.  OM 3: Moderate-large-caliber vessel with a proximal eccentric 90% stenosis Ramus intermedius: Small-caliber vessel with minimal luminal irregularities.  RCA: Very large caliber vessel with an anterior takeoff. There is a large atrial branch followed by 2 RV marginal branches. The vessel is then is 100% thrombotic occluded with no distal flow beyond the mid vessel.  Collateral flow from the LAD fills the RPDA with retrograde flow to the Right Posterior AV Groove Branch (RPAV) both of which are relatively free of disease. There is no retrograde flow up the native RCA to the occlusion site.   Assessment/Plan  Principal Problem:   Non-STEMI (non-ST elevated myocardial infarction) Active Problems:   Tobacco abuse   Dextrocardia   Dyslipidemia   CAD - severe multivessel disease of native vessels.    Plan: Day 1 s/p LHC revealing severe multivessel disease with 100% RCA occlusion, 90% OM 3, 80% bifurcation LAD-D1. Relatively well-preserved LV function with expected inferior hypokinesis. TCTS consulted for CABG. Pt was seen by Dr. Laneta Simmers last PM. Plan is for surgery tomorrow. He is CP free. Will continue IV heparin. HR and BP both stable. On ASA, statin and BB. Weak right radial pulse, but access site is otherwise stable. Will continue to monitor. MD to follow.    LOS: 2 days    Jason M.  Delmer Islam 12/28/2013 7:50 AM  I have seen and evaluated the patient this AM along with Boyce Medici, PA. I agree with her findings, examination as well as impression recommendations.  Doing well.  No further CP & no HF Sx.  BP/HR stable.   - on ASA, IV Heparin & BB/Statin Appreciate Dr. Sharee Pimple quick attention -- CABG tomorrow.  Thankfully EF is stable despite 100% RCA (likely related to brisk L-R Collateral flow).  On home Synthroid dose.  Nicoderm patch - smoking history. Will need counseling throughout hospital stay.  Pre-op tests today & Plan CABG in AM.     Marykay Lex, RobertsD., RobertsS. [facility] [address]  [address] [place], [place]  [address]  [phone] Pager # [phone] 12/28/2013 10:35 AM

## 2013-12-29 ENCOUNTER — Inpatient Hospital Stay (HOSPITAL_COMMUNITY): Payer: PRIVATE HEALTH INSURANCE

## 2013-12-29 ENCOUNTER — Encounter (HOSPITAL_COMMUNITY): Payer: PRIVATE HEALTH INSURANCE | Admitting: Certified Registered Nurse Anesthetist

## 2013-12-29 ENCOUNTER — Inpatient Hospital Stay (HOSPITAL_COMMUNITY): Payer: PRIVATE HEALTH INSURANCE | Admitting: Certified Registered Nurse Anesthetist

## 2013-12-29 ENCOUNTER — Encounter (HOSPITAL_COMMUNITY): Payer: Self-pay | Admitting: Anesthesiology

## 2013-12-29 ENCOUNTER — Encounter (HOSPITAL_COMMUNITY)
Admission: EM | Disposition: A | Discharge status: Home / Self Care | Payer: 59 | Source: Home / Self Care | Attending: Surgery

## 2013-12-29 DIAGNOSIS — I2510 Atherosclerotic heart disease of native coronary artery without angina pectoris: Secondary | ICD-10-CM

## 2013-12-29 DIAGNOSIS — I214 Non-ST elevation (NSTEMI) myocardial infarction: Secondary | ICD-10-CM

## 2013-12-29 DIAGNOSIS — Z951 Presence of aortocoronary bypass graft: Secondary | ICD-10-CM

## 2013-12-29 HISTORY — PX: CORONARY ARTERY BYPASS GRAFT: SHX141

## 2013-12-29 HISTORY — PX: INTRAOPERATIVE TRANSESOPHAGEAL ECHOCARDIOGRAM: SHX5062

## 2013-12-29 LAB — POCT I-STAT 4, (NA,K, GLUC, HGB,HCT)
Glucose, Bld: 132 mg/dL — ABNORMAL HIGH (ref 70–99)
Glucose, Bld: 115 mg/dL — ABNORMAL HIGH (ref 70–99)
Glucose, Bld: 111 mg/dL — ABNORMAL HIGH (ref 70–99)
Glucose, Bld: 123 mg/dL — ABNORMAL HIGH (ref 70–99)
Glucose, Bld: 117 mg/dL — ABNORMAL HIGH (ref 70–99)
HCT: 39 % (ref 39.0–52.0)
HCT: 37 % — ABNORMAL LOW (ref 39.0–52.0)
HCT: 30 % — ABNORMAL LOW (ref 39.0–52.0)
HCT: 32 % — ABNORMAL LOW (ref 39.0–52.0)
HCT: 36 % — ABNORMAL LOW (ref 39.0–52.0)
Hemoglobin: 13.3 g/dL (ref 13.0–17.0)
Hemoglobin: 12.6 g/dL — ABNORMAL LOW (ref 13.0–17.0)
Hemoglobin: 10.9 g/dL — ABNORMAL LOW (ref 13.0–17.0)
Hemoglobin: 12.2 g/dL — ABNORMAL LOW (ref 13.0–17.0)
Potassium: 3.9 meq/L (ref 3.7–5.3)
Potassium: 4.2 meq/L (ref 3.7–5.3)
Potassium: 5.5 mEq/L — ABNORMAL HIGH (ref 3.7–5.3)
Potassium: 4.8 mEq/L (ref 3.7–5.3)
Sodium: 139 meq/L (ref 137–147)
Sodium: 140 meq/L (ref 137–147)
Sodium: 136 mEq/L — ABNORMAL LOW (ref 137–147)
Sodium: 136 mEq/L — ABNORMAL LOW (ref 137–147)
Sodium: 139 mEq/L (ref 137–147)

## 2013-12-29 LAB — CBC
HCT: 41.5 % (ref 39.0–52.0)
HCT: 36 % — ABNORMAL LOW (ref 39.0–52.0)
MCH: 32.9 pg (ref 26.0–34.0)
MCH: 33.3 pg (ref 26.0–34.0)
MCHC: 33.9 g/dL (ref 30.0–36.0)
MCHC: 34.5 g/dL (ref 30.0–36.0)
MCV: 97.2 fL (ref 78.0–100.0)
MCV: 97 fL (ref 78.0–100.0)
MCV: 96.7 fL (ref 78.0–100.0)
Platelets: 220 10*3/uL (ref 150–400)
Platelets: 134 10*3/uL — ABNORMAL LOW (ref 150–400)
Platelets: 150 10*3/uL (ref 150–400)
RBC: 4.27 MIL/uL (ref 4.22–5.81)
RBC: 3.71 MIL/uL — ABNORMAL LOW (ref 4.22–5.81)
RDW: 12.8 % (ref 11.5–15.5)
RDW: 12.7 % (ref 11.5–15.5)
WBC: 10.8 10*3/uL — ABNORMAL HIGH (ref 4.0–10.5)
WBC: 12.4 10*3/uL — ABNORMAL HIGH (ref 4.0–10.5)
WBC: 13.8 10*3/uL — ABNORMAL HIGH (ref 4.0–10.5)

## 2013-12-29 LAB — POCT I-STAT 3, ART BLOOD GAS (G3+)
Acid-base deficit: 3 mmol/L — ABNORMAL HIGH (ref 0.0–2.0)
Acid-base deficit: 2 mmol/L (ref 0.0–2.0)
Bicarbonate: 26.4 meq/L — ABNORMAL HIGH (ref 20.0–24.0)
Bicarbonate: 26 mEq/L — ABNORMAL HIGH (ref 20.0–24.0)
Bicarbonate: 24.3 mEq/L — ABNORMAL HIGH (ref 20.0–24.0)
O2 Saturation: 100 %
O2 Saturation: 100 %
O2 Saturation: 96 %
Patient temperature: 35.5
Patient temperature: 37.2
Patient temperature: 37.9
TCO2: 28 mmol/L (ref 0–100)
TCO2: 28 mmol/L (ref 0–100)
TCO2: 26 mmol/L (ref 0–100)
TCO2: 25 mmol/L (ref 0–100)
pCO2 arterial: 48.4 mmHg — ABNORMAL HIGH (ref 35.0–45.0)
pCO2 arterial: 48.6 mmHg — ABNORMAL HIGH (ref 35.0–45.0)
pH, Arterial: 7.344 — ABNORMAL LOW (ref 7.350–7.450)
pH, Arterial: 7.342 — ABNORMAL LOW (ref 7.350–7.450)
pH, Arterial: 7.322 — ABNORMAL LOW (ref 7.350–7.450)
pH, Arterial: 7.287 — ABNORMAL LOW (ref 7.350–7.450)
pH, Arterial: 7.348 — ABNORMAL LOW (ref 7.350–7.450)
pO2, Arterial: 292 mmHg — ABNORMAL HIGH (ref 80.0–100.0)
pO2, Arterial: 272 mmHg — ABNORMAL HIGH (ref 80.0–100.0)
pO2, Arterial: 76 mmHg — ABNORMAL LOW (ref 80.0–100.0)

## 2013-12-29 LAB — HEMOGLOBIN AND HEMATOCRIT, BLOOD
HCT: 30.4 % — ABNORMAL LOW (ref 39.0–52.0)
Hemoglobin: 10.5 g/dL — ABNORMAL LOW (ref 13.0–17.0)

## 2013-12-29 LAB — URINALYSIS, ROUTINE W REFLEX MICROSCOPIC
Glucose, UA: NEGATIVE mg/dL
Leukocytes, UA: NEGATIVE
Nitrite: NEGATIVE
Protein, ur: NEGATIVE mg/dL
pH: 6 (ref 5.0–8.0)

## 2013-12-29 LAB — BLOOD GAS, ARTERIAL
Acid-base deficit: 0.3 mmol/L (ref 0.0–2.0)
Drawn by: 34779
FIO2: 0.21 %
O2 Saturation: 93.8 %
pCO2 arterial: 41.2 mmHg (ref 35.0–45.0)
pO2, Arterial: 70.6 mmHg — ABNORMAL LOW (ref 80.0–100.0)

## 2013-12-29 LAB — COMPREHENSIVE METABOLIC PANEL
Albumin: 3.2 g/dL — ABNORMAL LOW (ref 3.5–5.2)
Alkaline Phosphatase: 91 U/L (ref 39–117)
BUN: 15 mg/dL (ref 6–23)
CO2: 27 mEq/L (ref 19–32)
Calcium: 8.7 mg/dL (ref 8.4–10.5)
Creatinine, Ser: 0.98 mg/dL (ref 0.50–1.35)
GFR calc Af Amer: >90 mL/min (ref >90)
GFR calc non Af Amer: 89 mL/min — ABNORMAL LOW (ref >90)
Glucose, Bld: 99 mg/dL (ref 70–99)
Potassium: 4.2 mEq/L (ref 3.7–5.3)
Sodium: 141 mEq/L (ref 137–147)
Total Protein: 6.2 g/dL (ref 6.0–8.3)

## 2013-12-29 LAB — POCT I-STAT, CHEM 8
Glucose, Bld: 145 mg/dL — ABNORMAL HIGH (ref 70–99)
HCT: 40 % (ref 39.0–52.0)
Hemoglobin: 13.6 g/dL (ref 13.0–17.0)
Potassium: 4.1 mEq/L (ref 3.7–5.3)
Sodium: 139 mEq/L (ref 137–147)
TCO2: 24 mmol/L (ref 0–100)

## 2013-12-29 LAB — PLATELET COUNT: Platelets: 126 10*3/uL — ABNORMAL LOW (ref 150–400)

## 2013-12-29 LAB — MAGNESIUM: Magnesium: 2.7 mg/dL — ABNORMAL HIGH (ref 1.5–2.5)

## 2013-12-29 LAB — PROTIME-INR: Prothrombin Time: 16.1 seconds — ABNORMAL HIGH (ref 11.6–15.2)

## 2013-12-29 LAB — CREATININE, SERUM: Creatinine, Ser: 0.83 mg/dL (ref 0.50–1.35)

## 2013-12-29 SURGERY — CORONARY ARTERY BYPASS GRAFTING (CABG)
Anesthesia: General | Site: Chest

## 2013-12-29 MED ORDER — SODIUM CHLORIDE 0.9 % IV SOLN
INTRAVENOUS | Status: DC | PRN
  Administered 2013-12-29: 13:00:00 via INTRAVENOUS

## 2013-12-29 MED ORDER — ACETAMINOPHEN 160 MG/5ML PO SOLN
1000.0000 mg | Freq: Four times a day (QID) | ORAL | Status: DC
  Filled 2013-12-29: qty 40

## 2013-12-29 MED ORDER — HEPARIN SODIUM (PORCINE) 1000 UNIT/ML IJ SOLN
INTRAMUSCULAR | Status: DC | PRN
  Administered 2013-12-29: 5000 [IU] via INTRAVENOUS
  Administered 2013-12-29: 3000 [IU] via INTRAVENOUS
  Administered 2013-12-29 (×3): 10000 [IU] via INTRAVENOUS

## 2013-12-29 MED ORDER — THROMBIN 20000 UNITS EX SOLR
OROMUCOSAL | Status: DC | PRN
  Administered 2013-12-29 (×2): via TOPICAL

## 2013-12-29 MED ORDER — PHENYLEPHRINE HCL 10 MG/ML IJ SOLN
0.0000 ug/min | INTRAVENOUS | Status: DC
  Filled 2013-12-29: qty 2

## 2013-12-29 MED ORDER — FENTANYL CITRATE 0.05 MG/ML IJ SOLN
INTRAMUSCULAR | Status: DC | PRN
  Administered 2013-12-29: 50 ug via INTRAVENOUS
  Administered 2013-12-29: 250 ug via INTRAVENOUS
  Administered 2013-12-29: 50 ug via INTRAVENOUS
  Administered 2013-12-29: 200 ug via INTRAVENOUS
  Administered 2013-12-29: 100 ug via INTRAVENOUS
  Administered 2013-12-29 (×4): 50 ug via INTRAVENOUS
  Administered 2013-12-29: 100 ug via INTRAVENOUS
  Administered 2013-12-29: 500 ug via INTRAVENOUS
  Administered 2013-12-29 (×4): 50 ug via INTRAVENOUS
  Administered 2013-12-29: 100 ug via INTRAVENOUS

## 2013-12-29 MED ORDER — SODIUM CHLORIDE 0.9 % IV SOLN: 250.0000 mL | INTRAVENOUS | Status: DC

## 2013-12-29 MED ORDER — SODIUM CHLORIDE 0.9 % IV SOLN
INTRAVENOUS | Status: DC
  Filled 2013-12-29 (×2): qty 1

## 2013-12-29 MED ORDER — SODIUM CHLORIDE 0.9 % IJ SOLN
3.0000 mL | Freq: Two times a day (BID) | INTRAMUSCULAR | Status: DC
  Administered 2013-12-30 – 2014-01-02 (×7): 3 mL via INTRAVENOUS

## 2013-12-29 MED ORDER — MIDAZOLAM HCL 2 MG/2ML IJ SOLN: 2.0000 mg | INTRAMUSCULAR | Status: DC | PRN

## 2013-12-29 MED ORDER — SODIUM CHLORIDE 0.9 % IJ SOLN: 3.0000 mL | INTRAMUSCULAR | Status: DC | PRN

## 2013-12-29 MED ORDER — GLYCOPYRROLATE 0.2 MG/ML IJ SOLN
INTRAMUSCULAR | Status: DC | PRN
  Administered 2013-12-29: 0.2 mg via INTRAVENOUS

## 2013-12-29 MED ORDER — INSULIN REGULAR BOLUS VIA INFUSION
0.0000 [IU] | Freq: Three times a day (TID) | INTRAVENOUS | Status: DC
  Administered 2013-12-29: 1.7 [IU] via INTRAVENOUS
  Administered 2013-12-29: 2 [IU] via INTRAVENOUS
  Filled 2013-12-29: qty 10

## 2013-12-29 MED ORDER — ROCURONIUM BROMIDE 100 MG/10ML IV SOLN
INTRAVENOUS | Status: DC | PRN
  Administered 2013-12-29 (×2): 50 mg via INTRAVENOUS

## 2013-12-29 MED ORDER — DEXTROSE 5 % IV SOLN: 1.5000 g | Freq: Two times a day (BID) | INTRAVENOUS | Status: DC

## 2013-12-29 MED ORDER — LEVOFLOXACIN IN D5W 750 MG/150ML IV SOLN
750.0000 mg | Freq: Once | INTRAVENOUS | Status: AC
  Administered 2013-12-30: 750 mg via INTRAVENOUS
  Filled 2013-12-29: qty 150

## 2013-12-29 MED ORDER — SODIUM CHLORIDE 0.9 % IV SOLN: INTRAVENOUS | Status: DC

## 2013-12-29 MED ORDER — SODIUM CHLORIDE 0.45 % IV SOLN
INTRAVENOUS | Status: DC
  Administered 2013-12-29: 14:00:00 via INTRAVENOUS

## 2013-12-29 MED ORDER — SODIUM BICARBONATE 8.4 % IV SOLN
25.0000 meq | Freq: Once | INTRAVENOUS | Status: DC
  Filled 2013-12-29: qty 25

## 2013-12-29 MED ORDER — NITROGLYCERIN IN D5W 200-5 MCG/ML-% IV SOLN
0.0000 ug/min | INTRAVENOUS | Status: DC
  Administered 2013-12-29: 20 ug/min via INTRAVENOUS

## 2013-12-29 MED ORDER — BISACODYL 10 MG RE SUPP: 10.0000 mg | Freq: Every day | RECTAL | Status: DC

## 2013-12-29 MED ORDER — METOPROLOL TARTRATE 1 MG/ML IV SOLN: 2.5000 mg | INTRAVENOUS | Status: DC | PRN

## 2013-12-29 MED ORDER — DOCUSATE SODIUM 100 MG PO CAPS
200.0000 mg | ORAL_CAPSULE | Freq: Every day | ORAL | Status: DC
  Administered 2013-12-30 – 2014-01-03 (×5): 200 mg via ORAL
  Filled 2013-12-29 (×5): qty 2

## 2013-12-29 MED ORDER — ACETAMINOPHEN 160 MG/5ML PO SOLN
650.0000 mg | Freq: Once | ORAL | Status: AC
  Administered 2013-12-29: 650 mg

## 2013-12-29 MED ORDER — FAMOTIDINE IN NACL 20-0.9 MG/50ML-% IV SOLN
20.0000 mg | Freq: Two times a day (BID) | INTRAVENOUS | Status: AC
  Administered 2013-12-29: 20 mg via INTRAVENOUS
  Filled 2013-12-29: qty 50

## 2013-12-29 MED ORDER — ACETAMINOPHEN 650 MG RE SUPP: 650.0000 mg | Freq: Once | RECTAL | Status: AC

## 2013-12-29 MED ORDER — THROMBIN 20000 UNITS EX SOLR
CUTANEOUS | Status: AC
  Filled 2013-12-29: qty 20000

## 2013-12-29 MED ORDER — LACTATED RINGERS IV SOLN: 500.0000 mL | Freq: Once | INTRAVENOUS | Status: AC | PRN

## 2013-12-29 MED ORDER — POTASSIUM CHLORIDE 10 MEQ/50ML IV SOLN: 10.0000 meq | INTRAVENOUS | Status: AC

## 2013-12-29 MED ORDER — PANTOPRAZOLE SODIUM 40 MG PO TBEC
40.0000 mg | DELAYED_RELEASE_TABLET | Freq: Every day | ORAL | Status: DC
  Administered 2013-12-31 – 2014-01-03 (×4): 40 mg via ORAL
  Filled 2013-12-29 (×4): qty 1

## 2013-12-29 MED ORDER — LACTATED RINGERS IV SOLN
INTRAVENOUS | Status: DC | PRN
  Administered 2013-12-29: 08:00:00 via INTRAVENOUS

## 2013-12-29 MED ORDER — OXYCODONE HCL 5 MG PO TABS
5.0000 mg | ORAL_TABLET | ORAL | Status: DC | PRN
  Administered 2013-12-29 – 2013-12-30 (×5): 10 mg via ORAL
  Administered 2013-12-30: 5 mg via ORAL
  Administered 2013-12-30 – 2013-12-31 (×3): 10 mg via ORAL
  Administered 2013-12-31: 5 mg via ORAL
  Administered 2013-12-31: 10 mg via ORAL
  Administered 2013-12-31: 5 mg via ORAL
  Administered 2014-01-01 – 2014-01-03 (×12): 10 mg via ORAL
  Filled 2013-12-29 (×9): qty 2
  Filled 2013-12-29: qty 1
  Filled 2013-12-29 (×7): qty 2
  Filled 2013-12-29: qty 1
  Filled 2013-12-29 (×6): qty 2

## 2013-12-29 MED ORDER — PROPOFOL 10 MG/ML IV BOLUS
INTRAVENOUS | Status: DC | PRN
  Administered 2013-12-29: 50 mg via INTRAVENOUS

## 2013-12-29 MED ORDER — ASPIRIN EC 325 MG PO TBEC
325.0000 mg | DELAYED_RELEASE_TABLET | Freq: Every day | ORAL | Status: DC
  Administered 2013-12-30 – 2014-01-03 (×3): 325 mg via ORAL
  Filled 2013-12-29 (×5): qty 1

## 2013-12-29 MED ORDER — SODIUM BICARBONATE 8.4 % IV SOLN
50.0000 meq | Freq: Once | INTRAVENOUS | Status: AC
  Administered 2013-12-29: 50 meq via INTRAVENOUS
  Filled 2013-12-29: qty 50

## 2013-12-29 MED ORDER — ATORVASTATIN CALCIUM 80 MG PO TABS
80.0000 mg | ORAL_TABLET | Freq: Every day | ORAL | Status: DC
  Administered 2013-12-30 – 2014-01-02 (×4): 80 mg via ORAL
  Filled 2013-12-29 (×5): qty 1

## 2013-12-29 MED ORDER — MIDAZOLAM HCL 5 MG/5ML IJ SOLN
INTRAMUSCULAR | Status: DC | PRN
  Administered 2013-12-29 (×2): 2 mg via INTRAVENOUS
  Administered 2013-12-29: 3 mg via INTRAVENOUS
  Administered 2013-12-29 (×3): 5 mg via INTRAVENOUS

## 2013-12-29 MED ORDER — ASPIRIN 81 MG PO CHEW
324.0000 mg | CHEWABLE_TABLET | Freq: Every day | ORAL | Status: DC
  Administered 2013-12-31 – 2014-01-02 (×2): 324 mg
  Filled 2013-12-29 (×2): qty 4

## 2013-12-29 MED ORDER — HEMOSTATIC AGENTS (NO CHARGE) OPTIME
TOPICAL | Status: DC | PRN
  Administered 2013-12-29: 1 via TOPICAL

## 2013-12-29 MED ORDER — METOPROLOL TARTRATE 25 MG/10 ML ORAL SUSPENSION
12.5000 mg | Freq: Two times a day (BID) | ORAL | Status: DC
  Administered 2013-12-31: 12.5 mg
  Filled 2013-12-29 (×9): qty 5

## 2013-12-29 MED ORDER — PROTAMINE SULFATE 10 MG/ML IV SOLN
INTRAVENOUS | Status: DC | PRN
  Administered 2013-12-29: 90 mg via INTRAVENOUS
  Administered 2013-12-29: 50 mg via INTRAVENOUS
  Administered 2013-12-29: 10 mg via INTRAVENOUS
  Administered 2013-12-29 (×3): 50 mg via INTRAVENOUS

## 2013-12-29 MED ORDER — MORPHINE SULFATE 2 MG/ML IJ SOLN
2.0000 mg | INTRAMUSCULAR | Status: DC | PRN
  Administered 2013-12-29 (×3): 4 mg via INTRAVENOUS
  Administered 2013-12-30 (×2): 2 mg via INTRAVENOUS
  Administered 2013-12-30: 4 mg via INTRAVENOUS
  Administered 2013-12-30: 2 mg via INTRAVENOUS
  Filled 2013-12-29 (×4): qty 2
  Filled 2013-12-29 (×3): qty 1
  Filled 2013-12-29: qty 2

## 2013-12-29 MED ORDER — ARTIFICIAL TEARS OP OINT
TOPICAL_OINTMENT | OPHTHALMIC | Status: DC | PRN
  Administered 2013-12-29: 1 via OPHTHALMIC

## 2013-12-29 MED ORDER — ALBUMIN HUMAN 5 % IV SOLN: 250.0000 mL | INTRAVENOUS | Status: AC | PRN

## 2013-12-29 MED ORDER — METOPROLOL TARTRATE 12.5 MG HALF TABLET
12.5000 mg | ORAL_TABLET | Freq: Two times a day (BID) | ORAL | Status: DC
  Administered 2013-12-30 – 2014-01-01 (×5): 12.5 mg via ORAL
  Filled 2013-12-29 (×9): qty 1

## 2013-12-29 MED ORDER — VECURONIUM BROMIDE 10 MG IV SOLR
INTRAVENOUS | Status: DC | PRN
  Administered 2013-12-29 (×2): 5 mg via INTRAVENOUS
  Administered 2013-12-29: 2 mg via INTRAVENOUS

## 2013-12-29 MED ORDER — 0.9 % SODIUM CHLORIDE (POUR BTL) OPTIME
TOPICAL | Status: DC | PRN
  Administered 2013-12-29: 1000 mL

## 2013-12-29 MED ORDER — ACETAMINOPHEN 500 MG PO TABS
1000.0000 mg | ORAL_TABLET | Freq: Four times a day (QID) | ORAL | Status: DC
  Administered 2013-12-29 – 2014-01-03 (×18): 1000 mg via ORAL
  Filled 2013-12-29 (×20): qty 2

## 2013-12-29 MED ORDER — EPHEDRINE SULFATE 50 MG/ML IJ SOLN
INTRAMUSCULAR | Status: DC | PRN
  Administered 2013-12-29: 10 mg via INTRAVENOUS

## 2013-12-29 MED ORDER — MAGNESIUM SULFATE 40 MG/ML IJ SOLN
4.0000 g | Freq: Once | INTRAMUSCULAR | Status: AC
  Administered 2013-12-29: 4 g via INTRAVENOUS
  Filled 2013-12-29: qty 100

## 2013-12-29 MED ORDER — LACTATED RINGERS IV SOLN
INTRAVENOUS | Status: DC
  Administered 2013-12-29: 17:00:00 via INTRAVENOUS

## 2013-12-29 MED ORDER — LACTATED RINGERS IV SOLN
INTRAVENOUS | Status: DC | PRN
  Administered 2013-12-29 (×2): via INTRAVENOUS

## 2013-12-29 MED ORDER — CALCIUM CHLORIDE 10 % IV SOLN
INTRAVENOUS | Status: DC | PRN
  Administered 2013-12-29: .2 g via INTRAVENOUS

## 2013-12-29 MED ORDER — BISACODYL 5 MG PO TBEC
10.0000 mg | DELAYED_RELEASE_TABLET | Freq: Every day | ORAL | Status: DC
  Administered 2013-12-31 – 2014-01-02 (×3): 10 mg via ORAL
  Filled 2013-12-29 (×3): qty 2

## 2013-12-29 MED ORDER — DEXMEDETOMIDINE HCL IN NACL 200 MCG/50ML IV SOLN
0.1000 ug/kg/h | INTRAVENOUS | Status: DC
  Administered 2013-12-29 – 2013-12-30 (×2): 0.2 ug/kg/h via INTRAVENOUS
  Filled 2013-12-29 (×2): qty 50

## 2013-12-29 MED ORDER — MORPHINE SULFATE 2 MG/ML IJ SOLN
1.0000 mg | INTRAMUSCULAR | Status: AC | PRN
  Administered 2013-12-29 (×2): 2 mg via INTRAVENOUS

## 2013-12-29 MED ORDER — ONDANSETRON HCL 4 MG/2ML IJ SOLN: 4.0000 mg | Freq: Four times a day (QID) | INTRAMUSCULAR | Status: DC | PRN

## 2013-12-29 MED ORDER — VANCOMYCIN HCL IN DEXTROSE 1-5 GM/200ML-% IV SOLN
1000.0000 mg | Freq: Once | INTRAVENOUS | Status: AC
  Administered 2013-12-29: 1000 mg via INTRAVENOUS
  Filled 2013-12-29: qty 200

## 2013-12-29 SURGICAL SUPPLY — 102 items
ATTRACTOMAT 16X20 MAGNETIC DRP (DRAPES) ×3 IMPLANT
BAG DECANTER FOR FLEXI CONT (MISCELLANEOUS) ×3 IMPLANT
BANDAGE ELASTIC 4 VELCRO ST LF (GAUZE/BANDAGES/DRESSINGS) ×3 IMPLANT
BANDAGE ELASTIC 6 VELCRO ST LF (GAUZE/BANDAGES/DRESSINGS) ×3 IMPLANT
BANDAGE GAUZE ELAST BULKY 4 IN (GAUZE/BANDAGES/DRESSINGS) ×3 IMPLANT
BASKET HEART (ORDER IN 25'S) (MISCELLANEOUS) ×1
BASKET HEART (ORDER IN 25S) (MISCELLANEOUS) ×2 IMPLANT
BLADE STERNUM SYSTEM 6 (BLADE) ×3 IMPLANT
CANISTER SUCTION 2500CC (MISCELLANEOUS) ×3 IMPLANT
CANNULA VENOUS LOW PROF 34X46 (CANNULA) ×3 IMPLANT
CANNULA VESSEL 3MM BLUNT TIP (CANNULA) ×3 IMPLANT
CARDIAC SUCTION (MISCELLANEOUS) ×3 IMPLANT
CATH ROBINSON RED A/P 18FR (CATHETERS) ×6 IMPLANT
CATH THORACIC 28FR (CATHETERS) ×3 IMPLANT
CATH THORACIC 28FR RT ANG (CATHETERS) IMPLANT
CATH THORACIC 36FR (CATHETERS) ×3 IMPLANT
CATH THORACIC 36FR RT ANG (CATHETERS) ×3 IMPLANT
CLIP TI MEDIUM 24 (CLIP) IMPLANT
CLIP TI WIDE RED SMALL 24 (CLIP) ×2 IMPLANT
COVER SURGICAL LIGHT HANDLE (MISCELLANEOUS) ×3 IMPLANT
CRADLE DONUT ADULT HEAD (MISCELLANEOUS) ×3 IMPLANT
DRAPE CARDIOVASCULAR INCISE (DRAPES) ×3
DRAPE SLUSH/WARMER DISC (DRAPES) IMPLANT
DRAPE SRG 135X102X78XABS (DRAPES) ×2 IMPLANT
DRSG COVADERM 4X14 (GAUZE/BANDAGES/DRESSINGS) ×3 IMPLANT
ELECT CAUTERY BLADE 6.4 (BLADE) ×3 IMPLANT
ELECT REM PT RETURN 9FT ADLT (ELECTROSURGICAL) ×6
ELECTRODE REM PT RTRN 9FT ADLT (ELECTROSURGICAL) ×4 IMPLANT
GLOVE BIO SURGEON STRL SZ 6 (GLOVE) ×6 IMPLANT
GLOVE BIO SURGEON STRL SZ 6.5 (GLOVE) ×4 IMPLANT
GLOVE BIO SURGEON STRL SZ7 (GLOVE) IMPLANT
GLOVE BIO SURGEON STRL SZ7.5 (GLOVE) ×2 IMPLANT
GLOVE BIOGEL PI IND STRL 6 (GLOVE) IMPLANT
GLOVE BIOGEL PI IND STRL 6.5 (GLOVE) IMPLANT
GLOVE BIOGEL PI IND STRL 7.0 (GLOVE) ×3 IMPLANT
GLOVE BIOGEL PI INDICATOR 6 (GLOVE)
GLOVE BIOGEL PI INDICATOR 6.5 (GLOVE)
GLOVE BIOGEL PI INDICATOR 7.0 (GLOVE) ×3
GLOVE EUDERMIC 7 POWDERFREE (GLOVE) ×6 IMPLANT
GLOVE ORTHO TXT STRL SZ7.5 (GLOVE) IMPLANT
GOWN PREVENTION PLUS XLARGE (GOWN DISPOSABLE) ×3 IMPLANT
GOWN STRL NON-REIN LRG LVL3 (GOWN DISPOSABLE) ×15 IMPLANT
HEMOSTAT POWDER SURGIFOAM 1G (HEMOSTASIS) ×9 IMPLANT
HEMOSTAT SURGICEL 2X14 (HEMOSTASIS) ×3 IMPLANT
INSERT FOGARTY 61MM (MISCELLANEOUS) IMPLANT
INSERT FOGARTY XLG (MISCELLANEOUS) IMPLANT
KIT BASIN OR (CUSTOM PROCEDURE TRAY) ×3 IMPLANT
KIT CATH CPB BARTLE (MISCELLANEOUS) ×3 IMPLANT
KIT ROOM TURNOVER OR (KITS) ×3 IMPLANT
KIT SUCTION CATH 14FR (SUCTIONS) ×3 IMPLANT
KIT VASOVIEW W/TROCAR VH 2000 (KITS) ×3 IMPLANT
NDL 27GX1/2 REG BEVEL ECLIP (NEEDLE) IMPLANT
NEEDLE 27GX1/2 REG BEVEL ECLIP (NEEDLE) ×3 IMPLANT
NS IRRIG 1000ML POUR BTL (IV SOLUTION) ×15 IMPLANT
PACK OPEN HEART (CUSTOM PROCEDURE TRAY) ×3 IMPLANT
PAD ARMBOARD 7.5X6 YLW CONV (MISCELLANEOUS) ×6 IMPLANT
PAD ELECT DEFIB RADIOL ZOLL (MISCELLANEOUS) ×3 IMPLANT
PENCIL BUTTON HOLSTER BLD 10FT (ELECTRODE) ×3 IMPLANT
PUNCH AORTIC ROTATE 4.0MM (MISCELLANEOUS) IMPLANT
PUNCH AORTIC ROTATE 4.5MM 8IN (MISCELLANEOUS) ×3 IMPLANT
PUNCH AORTIC ROTATE 5MM 8IN (MISCELLANEOUS) IMPLANT
SET CARDIOPLEGIA MPS 5001102 (MISCELLANEOUS) ×1 IMPLANT
SPONGE GAUZE 4X4 12PLY (GAUZE/BANDAGES/DRESSINGS) ×6 IMPLANT
SPONGE GAUZE 4X4 12PLY STER LF (GAUZE/BANDAGES/DRESSINGS) ×2 IMPLANT
SPONGE INTESTINAL PEANUT (DISPOSABLE) IMPLANT
SPONGE LAP 18X18 X RAY DECT (DISPOSABLE) IMPLANT
SPONGE LAP 4X18 X RAY DECT (DISPOSABLE) ×3 IMPLANT
SUT BONE WAX W31G (SUTURE) ×3 IMPLANT
SUT MNCRL AB 4-0 PS2 18 (SUTURE) IMPLANT
SUT PROLENE 3 0 SH DA (SUTURE) IMPLANT
SUT PROLENE 3 0 SH1 36 (SUTURE) ×3 IMPLANT
SUT PROLENE 4 0 RB 1 (SUTURE)
SUT PROLENE 4 0 SH DA (SUTURE) IMPLANT
SUT PROLENE 4-0 RB1 .5 CRCL 36 (SUTURE) IMPLANT
SUT PROLENE 5 0 C 1 36 (SUTURE) IMPLANT
SUT PROLENE 6 0 C 1 30 (SUTURE) IMPLANT
SUT PROLENE 7 0 BV 1 (SUTURE) IMPLANT
SUT PROLENE 7 0 BV1 MDA (SUTURE) ×3 IMPLANT
SUT PROLENE 8 0 BV175 6 (SUTURE) IMPLANT
SUT SILK  1 MH (SUTURE)
SUT SILK 1 MH (SUTURE) IMPLANT
SUT STEEL STERNAL CCS#1 18IN (SUTURE) IMPLANT
SUT STEEL SZ 6 DBL 3X14 BALL (SUTURE) IMPLANT
SUT VIC AB 1 CTX 27 (SUTURE) ×1 IMPLANT
SUT VIC AB 1 CTX 36 (SUTURE) ×6
SUT VIC AB 1 CTX36XBRD ANBCTR (SUTURE) ×4 IMPLANT
SUT VIC AB 2-0 CT1 27 (SUTURE) ×3
SUT VIC AB 2-0 CT1 TAPERPNT 27 (SUTURE) IMPLANT
SUT VIC AB 2-0 CTX 27 (SUTURE) IMPLANT
SUT VIC AB 3-0 SH 27 (SUTURE)
SUT VIC AB 3-0 SH 27X BRD (SUTURE) IMPLANT
SUT VIC AB 3-0 X1 27 (SUTURE) ×1 IMPLANT
SUT VICRYL 4-0 PS2 18IN ABS (SUTURE) IMPLANT
SUTURE E-PAK OPEN HEART (SUTURE) ×3 IMPLANT
SYSTEM SAHARA CHEST DRAIN ATS (WOUND CARE) ×3 IMPLANT
TAPE CLOTH SURG 4X10 WHT LF (GAUZE/BANDAGES/DRESSINGS) ×4 IMPLANT
TOWEL OR 17X24 6PK STRL BLUE (TOWEL DISPOSABLE) ×3 IMPLANT
TOWEL OR 17X26 10 PK STRL BLUE (TOWEL DISPOSABLE) ×3 IMPLANT
TRAY FOLEY IC TEMP SENS 14FR (CATHETERS) ×3 IMPLANT
TUBING INSUFFLATION 10FT LAP (TUBING) ×3 IMPLANT
UNDERPAD 30X30 INCONTINENT (UNDERPADS AND DIAPERS) ×3 IMPLANT
WATER STERILE IRR 1000ML POUR (IV SOLUTION) ×6 IMPLANT

## 2013-12-29 NOTE — Anesthesia Preprocedure Evaluation (Signed)
Anesthesia Evaluation  Patient identified by MRN, date of birth, ID band Patient awake    Reviewed: Allergy & Precautions, H&P , NPO status , Patient's Chart, lab work & pertinent test results  Airway       Dental   Pulmonary Current Smoker,          Cardiovascular + CAD and + Past MI     Neuro/Psych    GI/Hepatic   Endo/Other    Renal/GU      Musculoskeletal   Abdominal   Peds  Hematology   Anesthesia Other Findings   Reproductive/Obstetrics                           Anesthesia Physical Anesthesia Plan  ASA: III  Anesthesia Plan: General   Post-op Pain Management:    Induction: Intravenous  Airway Management Planned: Oral ETT  Additional Equipment: Arterial line, CVP, PA Cath and TEE  Intra-op Plan:   Post-operative Plan: Post-operative intubation/ventilation  Informed Consent: I have reviewed the patients History and Physical, chart, labs and discussed the procedure including the risks, benefits and alternatives for the proposed anesthesia with the patient or authorized representative who has indicated his/her understanding and acceptance.     Plan Discussed with: CRNA, Anesthesiologist and Surgeon  Anesthesia Plan Comments:         Anesthesia Quick Evaluation

## 2013-12-29 NOTE — Preoperative (Signed)
Beta Blockers   Reason not to administer Beta Blockers:Not Applicable, given last PM at 2305

## 2013-12-29 NOTE — Brief Op Note (Signed)
      [address]       [name] [address]             [phone]     12/26/2013 - 12/29/2013  11:26 AM  PATIENT:  Jason Roberts  57 y.o. male  PRE-OPERATIVE DIAGNOSIS:  CAD  POST-OPERATIVE DIAGNOSIS:  CAD  PROCEDURE:  Procedure(s): CORONARY ARTERY BYPASS GRAFTING (CABG)X3 LIMA-LAD; SVG-OM; SVG-RCA INTRAOPERATIVE TRANSESOPHAGEAL ECHOCARDIOGRAM [facility] RIGHT LEG  SURGEON:  Surgeon(s): Alleen Borne, MD  PHYSICIAN ASSISTANT: Farid Grigorian PA-C  ANESTHESIA:   general  PATIENT CONDITION:  ICU - intubated and hemodynamically stable.  PRE-OPERATIVE WEIGHT: 91.1kg  COMPLICATIONS: NO KNOWN

## 2013-12-29 NOTE — Progress Notes (Signed)
Patient ID: Jason Roberts, male   DOB: [birth year], 57 y.o.   MRN: [id number]   SICU Evening Rounds:   Hemodynamically stable  CI = 2.0  Awake and ready for extubation   Urine output good  CT output low  CBC    Component Value Date/Time   WBC 12.4* 12/29/2013 1330   RBC 3.71* 12/29/2013 1330   HGB 12.2* 12/29/2013 1332   HCT 36.0* 12/29/2013 1332   PLT 134* 12/29/2013 1330   MCV 97.0 12/29/2013 1330   MCH 32.9 12/29/2013 1330   MCHC 33.9 12/29/2013 1330   RDW 12.6 12/29/2013 1330   LYMPHSABS 3.3 12/26/2013 1830   MONOABS 1.0 12/26/2013 1830   EOSABS 0.1 12/26/2013 1830   BASOSABS 0.0 12/26/2013 1830     BMET    Component Value Date/Time   NA 139 12/29/2013 1332   K 4.1 12/29/2013 1332   CL 103 12/29/2013 0500   CO2 27 12/29/2013 0500   GLUCOSE 117* 12/29/2013 1332   BUN 15 12/29/2013 0500   CREATININE 0.98 12/29/2013 0500   CALCIUM 8.7 12/29/2013 0500   GFRNONAA 89* 12/29/2013 0500   GFRAA >90 12/29/2013 0500     A/P:  Stable postop course. Continue current plans

## 2013-12-29 NOTE — Transfer of Care (Signed)
Immediate Anesthesia Transfer of Care Note  Patient: Jason Roberts  Procedure(s) Performed: Procedure(s) with comments: CORONARY ARTERY BYPASS GRAFTING (CABG) (N/A) - Times three  using left internal mammary artery and endoscopically harvested right saphenous vein INTRAOPERATIVE TRANSESOPHAGEAL ECHOCARDIOGRAM (N/A)  Patient Location: SICU  Anesthesia Type:General  Level of Consciousness: sedated and Patient remains intubated per anesthesia plan  Airway & Oxygen Therapy: Patient remains intubated per anesthesia plan and Patient placed on Ventilator (see vital sign flow sheet for setting)  Post-op Assessment: Report given to PACU RN and Post -op Vital signs reviewed and stable  Post vital signs: Reviewed and stable  Complications: No apparent anesthesia complications

## 2013-12-29 NOTE — Anesthesia Postprocedure Evaluation (Signed)
  Anesthesia Post-op Note  Patient: Jason Roberts  Procedure(s) Performed: Procedure(s) with comments: CORONARY ARTERY BYPASS GRAFTING (CABG) (N/A) - Times three  using left internal mammary artery and endoscopically harvested right saphenous vein INTRAOPERATIVE TRANSESOPHAGEAL ECHOCARDIOGRAM (N/A)  Patient Location: PACU  Anesthesia Type:General  Level of Consciousness: sedated and Patient remains intubated per anesthesia plan  Airway and Oxygen Therapy: Patient remains intubated per anesthesia plan and Patient placed on Ventilator (see vital sign flow sheet for setting)  Post-op Pain: none  Post-op Assessment: Post-op Vital signs reviewed, Patient's Cardiovascular Status Stable, Respiratory Function Stable, Patent Airway, No signs of Nausea or vomiting and Pain level controlled  Post-op Vital Signs: stable  Complications: No apparent anesthesia complications

## 2013-12-29 NOTE — Progress Notes (Signed)
  Echocardiogram Echocardiogram Transesophageal (OR) has been performed.  Jorje Guild 12/29/2013, 9:34 AM

## 2013-12-29 NOTE — OR Nursing (Signed)
SICU first call @1202 

## 2013-12-29 NOTE — Progress Notes (Signed)
12/29/13 extubated patient per Dr. Vladimir Faster orders with [facility]. Placed pt on 4L [place] sat 95%. Patient is stable. RT will continue to monitor.

## 2013-12-29 NOTE — Op Note (Signed)
CARDIOVASCULAR SURGERY OPERATIVE NOTE  12/29/2013  Surgeon:  Alleen Borne, MD  First Assistant: Gershon Crane, PA-C   Preoperative Diagnosis:  Severe multi-vessel coronary artery disease   Postoperative Diagnosis:  Same   Procedure:  1. Median Sternotomy 2. Extracorporeal circulation 3.   Coronary artery bypass grafting x 3   Left internal mammary graft to the LAD  SVG to OM3  SVG to PDA  4.   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 It was initially called in as a STEMI by EMS but in the ER was not felt to meet STEMI criteria. Initial Troponin i poc was 1.90. He was admitted and had no further chest pain. Troponin has been rising to 17 today. Cath this afternoon shows severe multi-vessel CAD as noted below with a completely occluded mid RCA with collaterals from the left. He has severe multi-vessel coronary disease with a significant NSTEMI due to RCA occlusion. I agree that CABG is the best treatment for this patient. He has dextrocardia which may make exposure more difficult and require cannulation of the femoral vessels for CPB. I discussed the operative procedure with the patient and his daughter who is a nurse including alternatives, benefits and risks; including but not limited to bleeding, blood transfusion, infection, stroke, myocardial infarction, graft failure, heart block requiring a permanent pacemaker, organ dysfunction, and death. Renelda Loma understands and agrees to proceed.     Preparation:  The patient  was seen in the preoperative holding area and the correct patient, correct operation were confirmed with the patient after reviewing the medical record and catheterization. The consent was signed by me. Preoperative antibiotics were given. A pulmonary arterial line and radial arterial line were placed by the anesthesia team. The patient was taken back to the operating room and positioned supine on the operating room table. After being placed under general endotracheal anesthesia by the anesthesia team a foley catheter was placed. The neck, chest, abdomen, and both legs were prepped with betadine soap and solution and draped in the usual sterile manner. A surgical time-out was taken and the correct patient and operative procedure were confirmed with the nursing and anesthesia staff.   Cardiopulmonary Bypass:  A median sternotomy was performed. The pericardium was opened in the midline. There was slight rightward rotation of the heart so that the PA was anterior but the cardiac chambers and coronaries were all in normal position.Right ventricular function appeared normal. The ascending aorta was of normal size and had no palpable plaque. There were no contraindications to aortic cannulation or cross-clamping. The patient was fully systemically heparinized and the ACT was maintained > 400 sec. The proximal aortic arch was cannulated with a 20 F aortic cannula for arterial inflow. Venous cannulation was performed via the right atrial appendage using a two-staged venous cannula. An antegrade cardioplegia/vent cannula was inserted into the mid-ascending aorta. Aortic occlusion was performed with a single cross-clamp. Systemic cooling to 32 degrees Centigrade and topical cooling of the heart with iced saline were used. Hyperkalemic antegrade cold blood cardioplegia was used to induce diastolic arrest and was then given at about 20  minute intervals throughout the period of arrest to maintain myocardial temperature at  or below 10 degrees centigrade. A temperature probe was inserted into the interventricular septum and an insulating pad was placed in the pericardium.   Left internal mammary harvest:  The left side of the sternum was retracted using the Rultract retractor. The left internal mammary artery was harvested as a pedicle graft. All side branches were clipped. It was a medium-sized vessel of good quality with excellent blood flow. It was ligated distally and divided. It was sprayed with topical papaverine solution to prevent vasospasm.   Endoscopic vein harvest:  The right greater saphenous vein was harvested endoscopically through a 2 cm incision medial to the right knee. It was harvested from the upper thigh to below the knee. It was a medium-sized vein of good quality. The side branches were all ligated with 4-0 silk ties.    Coronary arteries:  The coronary arteries were examined.   LAD:  Large vessel with no distal disease. The large first diagonal was intramyocardial throughout its extent. The diagonal had minimal ostial narrowing on cath and was not grafted.  LCX:  The moderate OM2 and large OM3 were visible on the surface and had no visible disease distally.   RCA:  The main vessel was diffusely diseased but the moderate-sized PDA had no significant disease. The PL was very small.   Grafts:  1. LIMA to the LAD: 2.0 mm. It was sewn end to side using 8-0 prolene continuous suture. 2. SVG to OM3:  1.75 mm. It was sewn end to side using 7-0 prolene continuous suture. 3. SVG to PDA:  1.75 mm. It was sewn end to side using 7-0 prolene continuous suture.   The proximal vein graft anastomoses were performed to the mid-ascending aorta using continuous 6-0 prolene suture. Graft markers were placed around the proximal anastomoses.   Completion:  The patient was rewarmed to 37 degrees Centigrade. The clamp was removed from the LIMA pedicle and there was rapid warming of the septum and  return of ventricular fibrillation. The crossclamp was removed with a time of 65 minutes. There was spontaneous return of sinus rhythm. The distal and proximal anastomoses were checked for hemostasis. The position of the grafts was satisfactory. Two temporary epicardial pacing wires were placed on the right atrium and two on the right ventricle. The patient was weaned from CPB without difficulty on no inotropes. CPB time was 88 minutes. Cardiac output was 6 LPM. Heparin was fully reversed with protamine and the aortic and venous cannulas removed. Hemostasis was achieved. Mediastinal and left pleural drainage tubes were placed. The sternum was closed with double #6 stainless steel wires. The fascia was closed with continuous # 1 vicryl suture. The subcutaneous tissue was closed with 2-0 vicryl continuous suture. The skin was closed with 3-0 vicryl subcuticular suture. All sponge, needle, and instrument counts were reported correct at the end of the case. Dry sterile dressings were placed over the incisions and around the chest tubes which were connected to pleurevac suction. The patient was then transported to the surgical intensive care unit in critical but stable condition.

## 2013-12-29 NOTE — Anesthesia Procedure Notes (Signed)
Procedures Procedures: Right IJ Theone Murdoch Catheter Insertion: [id number]: The patient was identified and consent obtained.  TO was performed, and full barrier precautions were used.  The skin was anesthetized with lidocaine-4cc plain with 25g needle.  Once the vein was located with the 22 ga. needle using ultrasound guidance , the wire was inserted into the vein.  The wire location was confirmed with ultrasound.  The tissue was dilated and the 8.5 [place] cordis catheter was carefully inserted. Afterwards Theone Murdoch catheter was inserted. PA catheter at 47cm.  The patient tolerated the procedure well.

## 2013-12-29 NOTE — Progress Notes (Signed)
ABGs to Dr. Laneta Simmers.  Orders rec'd.

## 2013-12-30 ENCOUNTER — Inpatient Hospital Stay (HOSPITAL_COMMUNITY): Payer: PRIVATE HEALTH INSURANCE

## 2013-12-30 LAB — BASIC METABOLIC PANEL
BUN: 14 mg/dL (ref 6–23)
CO2: 25 mEq/L (ref 19–32)
Calcium: 7.9 mg/dL — ABNORMAL LOW (ref 8.4–10.5)
Chloride: 103 mEq/L (ref 96–112)
Creatinine, Ser: 0.8 mg/dL (ref 0.50–1.35)
GFR calc Af Amer: >90 mL/min (ref >90)
GFR calc non Af Amer: >90 mL/min (ref >90)
Glucose, Bld: 106 mg/dL — ABNORMAL HIGH (ref 70–99)
Potassium: 4.5 mEq/L (ref 3.7–5.3)
Sodium: 138 mEq/L (ref 137–147)

## 2013-12-30 LAB — CBC
HCT: 37 % — ABNORMAL LOW (ref 39.0–52.0)
HCT: 34.9 % — ABNORMAL LOW (ref 39.0–52.0)
Hemoglobin: 12.5 g/dL — ABNORMAL LOW (ref 13.0–17.0)
Hemoglobin: 12.1 g/dL — ABNORMAL LOW (ref 13.0–17.0)
MCH: 32.8 pg (ref 26.0–34.0)
MCH: 33.2 pg (ref 26.0–34.0)
MCHC: 33.8 g/dL (ref 30.0–36.0)
MCHC: 34.7 g/dL (ref 30.0–36.0)
MCV: 97.1 fL (ref 78.0–100.0)
MCV: 95.6 fL (ref 78.0–100.0)
Platelets: 146 10*3/uL — ABNORMAL LOW (ref 150–400)
Platelets: 149 10*3/uL — ABNORMAL LOW (ref 150–400)
RBC: 3.81 MIL/uL — ABNORMAL LOW (ref 4.22–5.81)
RBC: 3.65 MIL/uL — ABNORMAL LOW (ref 4.22–5.81)
RDW: 12.7 % (ref 11.5–15.5)
RDW: 12.7 % (ref 11.5–15.5)
WBC: 12.4 10*3/uL — ABNORMAL HIGH (ref 4.0–10.5)
WBC: 10 10*3/uL (ref 4.0–10.5)

## 2013-12-30 LAB — GLUCOSE, CAPILLARY
GLUCOSE-CAPILLARY: 103 mg/dL — AB (ref 70–99)
GLUCOSE-CAPILLARY: 96 mg/dL (ref 70–99)
GLUCOSE-CAPILLARY: 106 mg/dL — AB (ref 70–99)
GLUCOSE-CAPILLARY: 108 mg/dL — AB (ref 70–99)
Glucose-Capillary: 102 mg/dL — ABNORMAL HIGH (ref 70–99)
Glucose-Capillary: 102 mg/dL — ABNORMAL HIGH (ref 70–99)
Glucose-Capillary: 102 mg/dL — ABNORMAL HIGH (ref 70–99)
Glucose-Capillary: 102 mg/dL — ABNORMAL HIGH (ref 70–99)
Glucose-Capillary: 110 mg/dL — ABNORMAL HIGH (ref 70–99)
Glucose-Capillary: 112 mg/dL — ABNORMAL HIGH (ref 70–99)
Glucose-Capillary: 115 mg/dL — ABNORMAL HIGH (ref 70–99)
Glucose-Capillary: 116 mg/dL — ABNORMAL HIGH (ref 70–99)
Glucose-Capillary: 116 mg/dL — ABNORMAL HIGH (ref 70–99)
Glucose-Capillary: 118 mg/dL — ABNORMAL HIGH (ref 70–99)
Glucose-Capillary: 118 mg/dL — ABNORMAL HIGH (ref 70–99)
Glucose-Capillary: 119 mg/dL — ABNORMAL HIGH (ref 70–99)
Glucose-Capillary: 127 mg/dL — ABNORMAL HIGH (ref 70–99)
Glucose-Capillary: 141 mg/dL — ABNORMAL HIGH (ref 70–99)
Glucose-Capillary: 98 mg/dL (ref 70–99)

## 2013-12-30 LAB — CREATININE, SERUM
Creatinine, Ser: 0.92 mg/dL (ref 0.50–1.35)
GFR calc Af Amer: >90 mL/min (ref >90)
GFR calc non Af Amer: >90 mL/min (ref >90)

## 2013-12-30 LAB — POCT I-STAT, CHEM 8
BUN: 14 mg/dL (ref 6–23)
CREATININE: 1 mg/dL (ref 0.50–1.35)
Calcium, Ion: 1.23 mmol/L (ref 1.12–1.23)
Chloride: 94 mEq/L — ABNORMAL LOW (ref 96–112)
GLUCOSE: 122 mg/dL — AB (ref 70–99)
HEMATOCRIT: 37 % — AB (ref 39.0–52.0)
Hemoglobin: 12.6 g/dL — ABNORMAL LOW (ref 13.0–17.0)
POTASSIUM: 4.3 meq/L (ref 3.7–5.3)
Sodium: 135 mEq/L — ABNORMAL LOW (ref 137–147)
TCO2: 29 mmol/L (ref 0–100)

## 2013-12-30 LAB — MAGNESIUM
Magnesium: 2.1 mg/dL (ref 1.5–2.5)
Magnesium: 1.8 mg/dL (ref 1.5–2.5)

## 2013-12-30 MED ORDER — NICOTINE 21 MG/24HR TD PT24
21.0000 mg | MEDICATED_PATCH | Freq: Every day | TRANSDERMAL | Status: DC
  Administered 2013-12-30 – 2014-01-03 (×5): 21 mg via TRANSDERMAL
  Filled 2013-12-30 (×6): qty 1

## 2013-12-30 MED ORDER — INSULIN ASPART 100 UNIT/ML ~~LOC~~ SOLN: 0.0000 [IU] | SUBCUTANEOUS | Status: DC

## 2013-12-30 MED ORDER — ENOXAPARIN SODIUM 40 MG/0.4ML ~~LOC~~ SOLN
40.0000 mg | Freq: Every day | SUBCUTANEOUS | Status: DC
  Administered 2013-12-30 – 2014-01-02 (×4): 40 mg via SUBCUTANEOUS
  Filled 2013-12-30 (×5): qty 0.4

## 2013-12-30 MED ORDER — FLUTICASONE PROPIONATE 50 MCG/ACT NA SUSP
2.0000 | Freq: Every day | NASAL | Status: DC
  Administered 2013-12-30 – 2014-01-03 (×5): 2 via NASAL
  Filled 2013-12-30: qty 16

## 2013-12-30 MED ORDER — KETOROLAC TROMETHAMINE 15 MG/ML IJ SOLN
15.0000 mg | Freq: Four times a day (QID) | INTRAMUSCULAR | Status: DC | PRN
  Administered 2013-12-30 (×2): 15 mg via INTRAVENOUS
  Filled 2013-12-30 (×2): qty 1

## 2013-12-30 MED ORDER — FUROSEMIDE 10 MG/ML IJ SOLN
40.0000 mg | Freq: Two times a day (BID) | INTRAMUSCULAR | Status: AC
  Administered 2013-12-30 (×2): 40 mg via INTRAVENOUS
  Filled 2013-12-30 (×2): qty 4

## 2013-12-30 NOTE — Progress Notes (Signed)
Utilization Review Completed.  

## 2013-12-30 NOTE — Progress Notes (Signed)
1 Day Post-Op Procedure(s) (LRB): CORONARY ARTERY BYPASS GRAFTING (CABG) (N/A) INTRAOPERATIVE TRANSESOPHAGEAL ECHOCARDIOGRAM (N/A) Subjective:  Chest wall soreness  Objective: Vital signs in last 24 hours: Temp:  [95.9 F (35.5 C)-100.8 F (38.2 C)] 99 F (37.2 C) (01/01 0800) Pulse Rate:  [79-96] 86 (01/01 0800) Cardiac Rhythm:  [-] Normal sinus rhythm (01/01 0800) Resp:  [10-21] 21 (01/01 0800) BP: (87-121)/(62-77) 111/70 mmHg (01/01 0800) SpO2:  [96 %-100 %] 99 % (01/01 0800) Arterial Line BP: (93-141)/(47-85) 131/69 mmHg (01/01 0800) FiO2 (%):  [40 %-50 %] 40 % (12/31 1715) Weight:  [96.9 kg (213 lb 10 oz)] 96.9 kg (213 lb 10 oz) (01/01 0530)  Hemodynamic parameters for last 24 hours: PAP: (22-39)/(12-24) 32/17 mmHg CO:  [4.1 L/min-6.3 L/min] 4.9 L/min CI:  [2 L/min/m2-3.1 L/min/m2] 2.4 L/min/m2  Intake/Output from previous day: 12/31 0701 - 01/01 0700 In: 3946.6 [I.V.:3395.6; Blood:201; IV Piggyback:350] Out: 3304 [Urine:2015; Blood:700; Chest Tube:589] Intake/Output this shift: Total I/O In: 40 [I.V.:40] Out: 40 [Urine:40]  General appearance: alert and cooperative Neurologic: intact Heart: regular rate and rhythm, S1, S2 normal, no murmur, click, rub or gallop Lungs: clear to auscultation bilaterally Extremities: edema mild Wound: dressings dry  Lab Results:  Recent Labs  12/29/13 1910 12/29/13 1918 12/30/13 0414  WBC 13.8*  --  12.4*  HGB 13.1 13.6 12.5*  HCT 38.0* 40.0 37.0*  PLT 150  --  146*   BMET:  Recent Labs  12/29/13 0500  12/29/13 1918 12/30/13 0414  NA 141  < > 139 138  K 4.2  < > 4.1 4.5  CL 103  --  103 103  CO2 27  --   --  25  GLUCOSE 99  < > 145* 106*  BUN 15  --  12 14  CREATININE 0.98  < > 0.90 0.80  CALCIUM 8.7  --   --  7.9*  < > = values in this interval not displayed.  PT/INR:  Recent Labs  12/29/13 1330  LABPROT 16.1*  INR 1.32   ABG    Component Value Date/Time   PHART 7.348* 12/29/2013 1914   HCO3 24.0  12/29/2013 1914   TCO2 24 12/29/2013 1918   ACIDBASEDEF 2.0 12/29/2013 1914   O2SAT 96.0 12/29/2013 1914   CBG (last 3)   Recent Labs  12/30/13 0254 12/30/13 0402 12/30/13 0505  GLUCAP 115* 103* 119*   CXR: mild left base atelectasis  ECG: NSR, inferior T-wave inversion  Assessment/Plan: S/P Procedure(s) (LRB): CORONARY ARTERY BYPASS GRAFTING (CABG) (N/A) INTRAOPERATIVE TRANSESOPHAGEAL ECHOCARDIOGRAM (N/A) Mobilize Diuresis Diabetes control d/c tubes/lines Continue foley due to patient in ICU and urinary output monitoring See progression orders   LOS: 4 days    BARTLE,BRYAN K 12/30/2013

## 2013-12-30 NOTE — Progress Notes (Signed)
Jason Roberts is a 58 y.o. male with "Dextrocardia" who presented with ACS - (cancelled STEMI) who ruled in for significant NSTEMI. Was pain free after initial Rx & maintained no Heparin overnight. Confirmation with the patient's wife reveals that he has been having stuttering off and on chest pain for the last several weeks.  Cardiac catheterization (12/27/13) revealed: 100% mid RCA (thrombotic) with L-R collaterals to rPDA; 90% OM3, 80% LAD-D1 bifurcation. Referred for CABG x 3 on 12/29/2013 (Dr. Laneta SimmersBartle).  Rotated heart, but not true Dextrocardia.  Subjective:  Extubated last PM  Some epigastric, lower rib cage soreness.   Otherwise looks well.  Objective:  Vital Signs in the last 24 hours: Temp:  [95.9 F (35.5 C)-100.8 F (38.2 C)] 99 F (37.2 C) (01/01 0800) Pulse Rate:  [79-96] 86 (01/01 0800) Resp:  [10-21] 21 (01/01 0800) BP: (87-121)/(62-77) 111/70 mmHg (01/01 0800) SpO2:  [96 %-100 %] 99 % (01/01 0800) Arterial Line BP: (93-141)/(47-85) 131/69 mmHg (01/01 0800) FiO2 (%):  [40 %-50 %] 40 % (12/31 1715) Weight:  [213 lb 10 oz (96.9 kg)] 213 lb 10 oz (96.9 kg) (01/01 0530)  Intake/Output from previous day: 12/31 0701 - 01/01 0700 In: 3946.6 [I.V.:3395.6; Blood:201; IV Piggyback:350] Out: 3304 [Urine:2015; Blood:700; Chest Tube:589] Intake/Output from this shift: Total I/O In: 40 [I.V.:40] Out: 40 [Urine:40]  Physical Exam: General appearance: alert, cooperative, appears stated age and mild post-op discomfort Neck: no adenopathy, no carotid bruit, no JVD, supple, symmetrical, trachea midline and thyroid not enlarged, symmetric, no tenderness/mass/nodules Lungs: clear to auscultation bilaterally and normal percussion bilaterally Heart: regular rate and rhythm, S1, S2 normal, no S3 or S4 and friction rub heard 3 part - t/o precordium. Abdomen: soft, non-tender; bowel sounds normal; no masses,  no organomegaly Extremities: extremities normal, atraumatic, no cyanosis  or edema Neurologic: Grossly normal  Lab Results:  Recent Labs  12/29/13 1910 12/29/13 1918 12/30/13 0414  WBC 13.8*  --  12.4*  HGB 13.1 13.6 12.5*  PLT 150  --  146*    Recent Labs  12/29/13 0500  12/29/13 1918 12/30/13 0414  NA 141  < > 139 138  K 4.2  < > 4.1 4.5  CL 103  --  103 103  CO2 27  --   --  25  GLUCOSE 99  < > 145* 106*  BUN 15  --  12 14  CREATININE 0.98  < > 0.90 0.80  < > = values in this interval not displayed. No results found for this basename: TROPONINI, CK, MB,  in the last 72 hours Hepatic Function Panel  Recent Labs  12/29/13 0500  PROT 6.2  ALBUMIN 3.2*  AST 21  ALT 22  ALKPHOS 91  BILITOT 0.4   Imaging: Imaging results have been reviewed  Cardiac Studies:  Assessment/Plan:  Principal Problem:   Non-STEMI (non-ST elevated myocardial infarction) Active Problems:   Tobacco abuse   Dextrocardia   Dyslipidemia   CAD - severe multivessel disease of native vessels.    S/P CABG x 3  Looks good POD#1 BP & HR stable -- off pressors. Anticipate titration of BB dose up  Today vs. Tomorrow.  PA pressures stable, would anticipate Swan d/c today along with CT as output appears to be low. Diuresis per CVTS. On statin.  Colleagues will follow along & be available as needed for assistance.  [facility] will help arrange f/u appt with me post d/c.   LOS: 4 days  HARDING,DAVID W 12/30/2013, 9:53 AM

## 2013-12-31 ENCOUNTER — Inpatient Hospital Stay (HOSPITAL_COMMUNITY): Payer: PRIVATE HEALTH INSURANCE

## 2013-12-31 DIAGNOSIS — I2510 Atherosclerotic heart disease of native coronary artery without angina pectoris: Secondary | ICD-10-CM

## 2013-12-31 LAB — BASIC METABOLIC PANEL
BUN: 17 mg/dL (ref 6–23)
CO2: 31 mEq/L (ref 19–32)
Calcium: 8.1 mg/dL — ABNORMAL LOW (ref 8.4–10.5)
Chloride: 97 mEq/L (ref 96–112)
Creatinine, Ser: 0.85 mg/dL (ref 0.50–1.35)
GFR calc Af Amer: >90 mL/min (ref >90)
GLUCOSE: 132 mg/dL — AB (ref 70–99)
Potassium: 4 mEq/L (ref 3.7–5.3)
SODIUM: 134 meq/L — AB (ref 137–147)

## 2013-12-31 LAB — CBC
HCT: 33.1 % — ABNORMAL LOW (ref 39.0–52.0)
HEMOGLOBIN: 11.3 g/dL — AB (ref 13.0–17.0)
MCH: 33 pg (ref 26.0–34.0)
MCHC: 34.1 g/dL (ref 30.0–36.0)
MCV: 96.8 fL (ref 78.0–100.0)
Platelets: 137 10*3/uL — ABNORMAL LOW (ref 150–400)
RBC: 3.42 MIL/uL — AB (ref 4.22–5.81)
RDW: 12.6 % (ref 11.5–15.5)
WBC: 10.9 10*3/uL — ABNORMAL HIGH (ref 4.0–10.5)

## 2013-12-31 LAB — GLUCOSE, CAPILLARY
GLUCOSE-CAPILLARY: 117 mg/dL — AB (ref 70–99)
GLUCOSE-CAPILLARY: 113 mg/dL — AB (ref 70–99)
GLUCOSE-CAPILLARY: 99 mg/dL (ref 70–99)

## 2013-12-31 MED ORDER — GUAIFENESIN ER 600 MG PO TB12
600.0000 mg | ORAL_TABLET | Freq: Two times a day (BID) | ORAL | Status: DC
  Administered 2013-12-31: 600 mg via ORAL
  Filled 2013-12-31 (×3): qty 1

## 2013-12-31 MED ORDER — POTASSIUM CHLORIDE CRYS ER 20 MEQ PO TBCR
20.0000 meq | EXTENDED_RELEASE_TABLET | Freq: Every day | ORAL | Status: DC
  Administered 2013-12-31 – 2014-01-03 (×4): 20 meq via ORAL
  Filled 2013-12-31 (×6): qty 1

## 2013-12-31 MED ORDER — FUROSEMIDE 40 MG PO TABS
40.0000 mg | ORAL_TABLET | Freq: Every day | ORAL | Status: DC
  Administered 2013-12-31: 40 mg via ORAL
  Filled 2013-12-31 (×2): qty 1

## 2013-12-31 MED ORDER — LACTULOSE 10 GM/15ML PO SOLN
20.0000 g | Freq: Once | ORAL | Status: AC
  Administered 2013-12-31: 20 g via ORAL
  Filled 2013-12-31: qty 30

## 2013-12-31 MED ORDER — METOCLOPRAMIDE HCL 10 MG PO TABS: 10.0000 mg | ORAL_TABLET | Freq: Three times a day (TID) | ORAL | Status: DC

## 2013-12-31 MED FILL — Lidocaine HCl IV Inj 20 MG/ML: INTRAVENOUS | Qty: 5 | Status: AC

## 2013-12-31 MED FILL — Heparin Sodium (Porcine) Inj 1000 Unit/ML: INTRAMUSCULAR | Qty: 30 | Status: AC

## 2013-12-31 MED FILL — Mannitol IV Soln 20%: INTRAVENOUS | Qty: 500 | Status: AC

## 2013-12-31 MED FILL — Magnesium Sulfate Inj 50%: INTRAMUSCULAR | Qty: 10 | Status: AC

## 2013-12-31 MED FILL — Sodium Chloride IV Soln 0.9%: INTRAVENOUS | Qty: 2000 | Status: AC

## 2013-12-31 MED FILL — Heparin Sodium (Porcine) Inj 1000 Unit/ML: INTRAMUSCULAR | Qty: 10 | Status: AC

## 2013-12-31 MED FILL — Sodium Bicarbonate IV Soln 8.4%: INTRAVENOUS | Qty: 50 | Status: AC

## 2013-12-31 MED FILL — Potassium Chloride Inj 2 mEq/ML: INTRAVENOUS | Qty: 40 | Status: AC

## 2013-12-31 MED FILL — Electrolyte-R (PH 7.4) Solution: INTRAVENOUS | Qty: 3000 | Status: AC

## 2013-12-31 NOTE — Progress Notes (Addendum)
[facility] DAILY ICU PROGRESS NOTE                   [address]            [place] [id number]          [phone]   2 Days Post-Op Procedure(s) (LRB): CORONARY ARTERY BYPASS GRAFTING (CABG) (N/A) INTRAOPERATIVE TRANSESOPHAGEAL ECHOCARDIOGRAM (N/A)  Total Length of Stay:  LOS: 5 days   Subjective: Patient passing flatus but no bowel movement yet. Requesting laxative as feels "uncomfortable".  Objective: Vital signs in last 24 hours: Temp:  [98 F (36.7 C)-99.3 F (37.4 C)] 98.3 F (36.8 C) (01/02 0000) Pulse Rate:  [58-98] 90 (01/02 0700) Cardiac Rhythm:  [-] Normal sinus rhythm (01/02 0600) Resp:  [13-24] 23 (01/02 0700) BP: (94-148)/(58-88) 104/63 mmHg (01/02 0700) SpO2:  [93 %-100 %] 97 % (01/02 0700) Arterial Line BP: (131-159)/(69-77) 159/77 mmHg (01/01 1100) Weight:  [96.6 kg (212 lb 15.4 oz)] 96.6 kg (212 lb 15.4 oz) (01/02 0500)  Filed Weights   12/29/13 0500 12/30/13 0530 12/31/13 0500  Weight: 91.1 kg (200 lb 13.4 oz) 96.9 kg (213 lb 10 oz) 96.6 kg (212 lb 15.4 oz)    Weight change: -0.3 kg (-10.6 oz)   Hemodynamic parameters for last 24 hours: PAP: (32-41)/(17-23) 41/23 mmHg  Intake/Output from previous day: 01/01 0701 - 01/02 0700 In: 1630 [P.O.:1160; I.V.:320; IV Piggyback:150] Out: 1670 [Urine:1590; Chest Tube:80]  Intake/Output this shift:    Current Meds: Scheduled Meds: . acetaminophen  1,000 mg Oral Q6H   Or  . acetaminophen (TYLENOL) oral liquid 160 mg/5 mL  1,000 mg Per Tube Q6H  . aspirin EC  325 mg Oral Daily   Or  . aspirin  324 mg Per Tube Daily  . atorvastatin  80 mg Oral q1800  . bisacodyl  10 mg Oral Daily   Or  . bisacodyl  10 mg Rectal Daily  . docusate sodium  200 mg Oral Daily  . enoxaparin (LOVENOX) injection  40 mg Subcutaneous QHS  . fluticasone  2 spray Each Nare Daily  . insulin aspart  0-24 Units Subcutaneous Q4H  . insulin aspart  0-24 Units Subcutaneous Q4H  . levothyroxine  50 mcg Oral QAC breakfast   . metoprolol tartrate  12.5 mg Oral BID   Or  . metoprolol tartrate  12.5 mg Per Tube BID  . nicotine  21 mg Transdermal Daily  . pantoprazole  40 mg Oral Daily  . sodium chloride  3 mL Intravenous Q12H   Continuous Infusions: . sodium chloride Stopped (12/30/13 1200)  . sodium chloride Stopped (12/29/13 1315)  . sodium chloride    . dexmedetomidine Stopped (12/30/13 0400)  . lactated ringers Stopped (12/30/13 1800)  . nitroGLYCERIN Stopped (12/29/13 1500)  . phenylephrine (NEO-SYNEPHRINE) Adult infusion Stopped (12/29/13 1800)   PRN Meds:.ketorolac, metoprolol, midazolam, morphine injection, ondansetron (ZOFRAN) IV, oxyCODONE, sodium chloride  General appearance: alert, cooperative and no distress Neurologic: intact Heart: regular rate and rhythm Lungs: clear to auscultation bilaterally Abdomen: Soft, slight distention, sporadic bowel sounds, non tender Extremities: LE edema bilaterally Wound: Sternal dressing is clean and dry;RLE wounds are clean and dry. No signs of infection  Lab Results: CBC: Recent Labs  12/30/13 1615 12/30/13 1632 12/31/13 0425  WBC 10.0  --  10.9*  HGB 12.1* 12.6* 11.3*  HCT 34.9* 37.0* 33.1*  PLT 149*  --  137*   BMET:  Recent Labs  12/30/13 0414  12/30/13 1632  12/31/13 0425  NA 138  --  135* 134*  K 4.5  --  4.3 4.0  CL 103  --  94* 97  CO2 25  --   --  31  GLUCOSE 106*  --  122* 132*  BUN 14  --  14 17  CREATININE 0.80  < > 1.00 0.85  CALCIUM 7.9*  --   --  8.1*  < > = values in this interval not displayed.  PT/INR:  Recent Labs  12/29/13 1330  LABPROT 16.1*  INR 1.32   Radiology: Dg Chest Portable 1 View In Am  12/30/2013   CLINICAL DATA:  Midchest pain and spasms.  Shortness of breath.  EXAM: PORTABLE CHEST - 1 VIEW  COMPARISON:  12/29/2013  FINDINGS: There is a Swan-Ganz catheter with the tip projecting over the right ventricular outflow tract. There has been interval removal of the endotracheal tube and nasogastric tubes.  The mediastinal drains are in satisfactory position. There is a left-sided chest tube in unchanged position. There is elevation of the left diaphragm. There is persistent left basilar atelectasis. There is right basilar atelectasis. Changes secondary to CABG. Stable cardiomediastinal silhouette.  IMPRESSION: Postoperative changes from CABG. No left pneumothorax. Bibasilar atelectasis.   Electronically Signed   By: Elige Ko   On: 12/30/2013 08:21   Dg Chest Portable 1 View  12/29/2013   CLINICAL DATA:  Post bypass.  EXAM: PORTABLE CHEST - 1 VIEW  COMPARISON:  12/26/2013  FINDINGS: Changes of CABG. Endotracheal tube is 3.7 cm above the carina. Left basilar chest tube in place. No pneumothorax. Swan-Ganz catheter is in the main pulmonary artery. NG tube enters the stomach.  Left base atelectasis. Right lung is clear. No effusions visualized.  IMPRESSION: Postoperative changes from CABG. Left base atelectasis. No pneumothorax.  Support devices as above.   Electronically Signed   By: Charlett Nose M.D.   On: 12/29/2013 13:59     Assessment/Plan: S/P Procedure(s) (LRB): CORONARY ARTERY BYPASS GRAFTING (CABG) (N/A) INTRAOPERATIVE TRANSESOPHAGEAL ECHOCARDIOGRAM (N/A)  1.CV-SR. On Lopressor 12.5 bid 2.Pulmonary-CXR this am appears to show no pneumothorax, bibasilar atelectasis, elevation of left hemidiaphragm. 3.Volume Overload-Lasix 40 daily 4.ABL anemia-H and H 11.3 and 33.1 5.Mild thrombocytopenia-platelets 137,000 6. CBGs 106/108/117. Pre op HGA1C 5.9. Will stop accu checks 7.LOC constipation 8.Transfer to 2000    Ardelle Balls PA-C 12/31/2013 7:36 AM  Chart reviewed, patient examined, agree with above.

## 2013-12-31 NOTE — Progress Notes (Signed)
Subjective: Has chest wall soreness when he coughs, but denies any chest pain. No SOB. Has been ambulating w/ cardiac rehab w/o difficulty.   Objective: Vital signs in last 24 hours: Temp:  [98 F (36.7 C)-99.3 F (37.4 C)] 98.6 F (37 C) (01/02 0828) Pulse Rate:  [58-98] 90 (01/02 0700) Resp:  [13-24] 23 (01/02 0700) BP: (94-148)/(58-88) 104/63 mmHg (01/02 0700) SpO2:  [93 %-100 %] 97 % (01/02 0700) Arterial Line BP: (149-159)/(73-77) 159/77 mmHg (01/01 1100) Weight:  [96.6 kg (212 lb 15.4 oz)] 96.6 kg (212 lb 15.4 oz) (01/02 0500) Last BM Date: 12/29/13  Intake/Output from previous day: 01/01 0701 - 01/02 0700 In: 1630 [P.O.:1160; I.V.:320; IV Piggyback:150] Out: 1670 [Urine:1590; Chest Tube:80] Intake/Output this shift:    Medications Current Facility-Administered Medications  Medication Dose Route Frequency Provider Last Rate Last Dose  . 0.45 % sodium chloride infusion   Intravenous Continuous Wayne E Gold, PA-C      . 0.9 %  sodium chloride infusion   Intravenous Continuous Wayne E Gold, PA-C      . 0.9 %  sodium chloride infusion  250 mL Intravenous Continuous Wayne E Gold, PA-C      . acetaminophen (TYLENOL) tablet 1,000 mg  1,000 mg Oral Q6H Wayne E Gold, PA-C   1,000 mg at 12/31/13 [id number]   Or  . acetaminophen (TYLENOL) solution 1,000 mg  1,000 mg Per Tube Q6H Wayne E Gold, PA-C      . aspirin EC tablet 325 mg  325 mg Oral Daily Wayne E Gold, PA-C   325 mg at 12/30/13 1100   Or  . aspirin chewable tablet 324 mg  324 mg Per Tube Daily Wayne E Gold, PA-C      . atorvastatin (LIPITOR) tablet 80 mg  80 mg Oral q1800 Wayne E Gold, PA-C   80 mg at 12/30/13 1826  . bisacodyl (DULCOLAX) EC tablet 10 mg  10 mg Oral Daily Wayne E Gold, PA-C       Or  . bisacodyl (DULCOLAX) suppository 10 mg  10 mg Rectal Daily Wayne E Gold, PA-C      . docusate sodium (COLACE) capsule 200 mg  200 mg Oral Daily Wayne E Gold, PA-C   200 mg at 12/30/13 1100  . enoxaparin (LOVENOX) injection  40 mg  40 mg Subcutaneous QHS Alleen BorneBryan K Bartle, MD   40 mg at 12/30/13 2200  . fluticasone (FLONASE) 50 MCG/ACT nasal spray 2 spray  2 spray Each Nare Daily Alleen BorneBryan K Bartle, MD   2 spray at 12/30/13 1327  . furosemide (LASIX) tablet 40 mg  40 mg Oral Daily Donielle Margaretann LovelessM Zimmerman, PA-C      . ketorolac (TORADOL) 15 MG/ML injection 15 mg  15 mg Intravenous Q6H PRN Alleen BorneBryan K Bartle, MD   15 mg at 12/30/13 1825  . lactated ringers infusion   Intravenous Continuous Wayne E Gold, PA-C      . lactulose (CHRONULAC) 10 GM/15ML solution 20 g  20 g Oral Once Donielle M Zimmerman, PA-C      . levothyroxine (SYNTHROID, LEVOTHROID) tablet 50 mcg  50 mcg Oral QAC breakfast Marykay Lexavid W Harding, MD   50 mcg at 12/30/13 1100  . metoprolol (LOPRESSOR) injection 2.5-5 mg  2.5-5 mg Intravenous Q2H PRN Wayne E Gold, PA-C      . metoprolol tartrate (LOPRESSOR) tablet 12.5 mg  12.5 mg Oral BID Wayne E Gold, PA-C   12.5 mg at 12/30/13 2200   Or  .  metoprolol tartrate (LOPRESSOR) 25 mg/10 mL oral suspension 12.5 mg  12.5 mg Per Tube BID Wayne E Gold, PA-C      . morphine 2 MG/ML injection 2-5 mg  2-5 mg Intravenous Q1H PRN Wayne E Gold, PA-C   2 mg at 12/30/13 1257  . nicotine (NICODERM CQ - dosed in mg/24 hours) patch 21 mg  21 mg Transdermal Daily Alleen Borne, MD   21 mg at 12/30/13 1200  . ondansetron (ZOFRAN) injection 4 mg  4 mg Intravenous Q6H PRN Wayne E Gold, PA-C      . oxyCODONE (Oxy IR/ROXICODONE) immediate release tablet 5-10 mg  5-10 mg Oral Q3H PRN Rowe Clack, PA-C   5 mg at 12/31/13 0816  . pantoprazole (PROTONIX) EC tablet 40 mg  40 mg Oral Daily Wayne E Gold, PA-C      . potassium chloride SA (K-DUR,KLOR-CON) CR tablet 20 mEq  20 mEq Oral Daily Donielle Margaretann Loveless, PA-C      . sodium chloride 0.9 % injection 3 mL  3 mL Intravenous Q12H Wayne E Gold, PA-C   3 mL at 12/30/13 2159  . sodium chloride 0.9 % injection 3 mL  3 mL Intravenous PRN Rowe Clack, PA-C        PE: General appearance: alert, cooperative  and no distress Lungs: decreased BS at the bases Heart: regular rate and rhythm, S1, S2 normal, no murmur, click, rub or gallop Extremities: trace LEE Pulses: 2+ and symmetric Skin: warm and dry Neurologic: Grossly normal  Lab Results:   Recent Labs  12/30/13 0414 12/30/13 1615 12/30/13 1632 12/31/13 0425  WBC 12.4* 10.0  --  10.9*  HGB 12.5* 12.1* 12.6* 11.3*  HCT 37.0* 34.9* 37.0* 33.1*  PLT 146* 149*  --  137*   BMET  Recent Labs  12/29/13 0500  12/30/13 0414 12/30/13 1615 12/30/13 1632 12/31/13 0425  NA 141  < > 138  --  135* 134*  K 4.2  < > 4.5  --  4.3 4.0  CL 103  < > 103  --  94* 97  CO2 27  --  25  --   --  31  GLUCOSE 99  < > 106*  --  122* 132*  BUN 15  < > 14  --  14 17  CREATININE 0.98  < > 0.80 0.92 1.00 0.85  CALCIUM 8.7  --  7.9*  --   --  8.1*  < > = values in this interval not displayed. 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    S/P CABG x 3  Plan: Day 2 s/p CABG x 3. Progressing well. Out of bed and sitting in chair today. No chest pain or SOB. Ambulating w/o difficulty. NSR on telemetry. No post-operative arrhthymias noted. HR and BP both stable. I/Os negative. Renal function normal. Continue ASA, BB, statin and Lasix. ? Adding low dose ACE/ARB prior to d/c. Plan is for possible transfer to telemetry bed today. Will continue to follow.     LOS: 5 days    Brittainy M. Sharol Harness, PA-C 12/31/2013 9:03 AM

## 2013-12-31 NOTE — Discharge Summary (Signed)
Physician Discharge Summary       [address]       [place] [id number]             [phone]    Patient ID: Jason Roberts MRN: [id number] DOB/AGE: Apr 17, 1956 58 y.o.  Admit date: 12/26/2013 Discharge date: 01/02/2014  Admission Diagnoses: 1.S/p NSTEMI 2. Multivessel CAD 3.History of tobacco abuse 4.History of dextrocardia  Discharge Diagnoses:  1.S/p NSTEMI 2. Multivessel CAD 3.History of tobacco abuse 4.History of dextrocardia 5.ABL anemia 6.Mild thrombocytopenia 7.Pre diabetic (HGA1C 5.9)  Procedure (s):  1.Cardiac catheterization done by Dr. Herbie BaltimoreHarding on 12/27/2013: Left Ventriculography:  EF: 50-55 %  Wall Motion: Basal-mid inferior hypokinesis Coronary Anatomy:  Left Main: Large caliber vessel that trifurcates into the LAD, Circumflex, and small-caliber Ramus Intermedius; angiographically normal  LAD: Large-caliber vessel, that wraps the apex. There is a 80% relatively focal stenosis at the bifurcation point for the first major diagonal branch (D1) and a large septal perforator. Beyond the stenosis there is a large branching septal trunk, then the LAD tapers down around the apex giving rise to one smaller diagonal branch distally. There is brisk collateral flow from septal perforators and the distal LAD to the RPDA that fills retrograde to the Right Posterolateral branches.  D1: Moderate caliber vessel that takes off at the site of the focal 80% LAD lesion. The diagonal itself is relatively free of disease. Left Circumflex: Very large caliber vessel that gives off 2 moderate -caliber OM branches (OM 1 and OM 2) then bifurcates into the small caliber AV groove circumflex and a large OM 3. There is a small atrial branch that comes off in the site as well.  OM1 and 2: Moderate caliber vessels, OM 2 has a roughly 40% proximal stenosis and is a larger of the 2 branches.  OM 3: Moderate-large-caliber vessel with a proximal eccentric 90% stenosis Ramus  intermedius: Small-caliber vessel with minimal luminal irregularities.  RCA: Very large caliber vessel with an anterior takeoff. There is a large atrial branch followed by 2 RV marginal branches. The vessel is then is 100% thrombotic occluded with no distal flow beyond the mid vessel.  Collateral flow from the LAD fills the RPDA with retrograde flow to the Right Posterior AV Groove Branch (RPAV) both of which are relatively free of disease. There is no retrograde flow up the native RCA to the occlusion site.  1. Median Sternotomy 2. Extracorporeal circulation 3. Coronary artery bypass grafting x 3  Left internal mammary graft to the LAD  SVG to OM3  SVG to PDA 4. Endoscopic vein harvest from the right leg by Dr. Laneta SimmersBartle on 12/28/2013.   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 He came to the ER by EMS that evening. It was initially called in as a STEMI by EMS but in the ER was not felt to meet STEMI criteria. He did rule in for a NSTEMI as initial Troponin i poc was 1.90. He was admitted and had no further chest pain. Troponin has been rising to 17 today. Cardiac catheterization done 12/29 shows severe multi-vessel CAD with a completely occluded mid RCA with collaterals from the left. Echo was done that showed LVEF 55-60%, no significant valvular  disease, and no pericardial effusion.Dr. Laneta Simmers was consulted for the consideration of coronary artery bypass grafting surgery. Potential risks, benefits, and complications were discussed with the patient and he agreed to proceed. Pre operative carotid duplex US showed no significant bilateral carotid artery stenosis.   Brief Hospital Course:  The patient was extubated the evening of surgery  without difficulty. He remained afebrile and hemodynamically stable. Theone Murdoch, a line, chest tubes, and foley were removed early in the post operative course.He did have ABL anemia. His last H and H was 11.3 and 33.1. He did not require a post op transfusion. He also had mild thrombocytopenia. His last platelet count was 137,000. Lopressor was started and titrated accordingly. He was volume over loaded and diuresed. He was weaned off the insulin drip.  The patient's HGA1C pre op was 5.9.He is likely pre diabetic and will need further surveillance as an outpatient. The patient was felt surgically stable for transfer from the ICU to PCTU for further convalescence on 12/31/2012. He continues to progress with cardiac rehab. He was ambulating on room air. He has been tolerating a diet and has had a bowel movement. Epicardial pacing wires and chest tube sutures will be removed prior to discharge. Provided the patient remains afebrile, hemodynamically stable, and pending morning round evaluation, he will be surgically stable for discharge on 01/03/2013.   Latest Vital Signs: Blood pressure 120/72, pulse 90, temperature 98.8 F (37.1 C), temperature source Oral, resp. rate 18, height 5\' 10"  (1.778 m), weight 93.1 kg (205 lb 4 oz), SpO2 93.00%.  Physical Exam: General appearance: alert, cooperative and no distress  Neurologic: intact  Heart: regular rate and rhythm  Lungs: clear to auscultation bilaterally  Abdomen: Soft, slight distention, sporadic bowel sounds, non tender  Extremities: LE edema bilaterally  Wound: Sternal dressing is clean and dry;RLE wounds are clean and dry. No signs of infection   Discharge Condition:Stable  Recent laboratory studies:  Lab Results  Component Value Date   WBC 10.9* 12/31/2013   HGB 11.3* 12/31/2013   HCT 33.1* 12/31/2013   MCV 96.8 12/31/2013   PLT 137* 12/31/2013   Lab Results  Component Value Date   NA 134* 12/31/2013   K 4.0 12/31/2013   CL 97 12/31/2013   CO2 31  12/31/2013   CREATININE 0.85 12/31/2013   GLUCOSE 132* 12/31/2013      Diagnostic Studies: Dg Chest Port 1 View  12/31/2013   CLINICAL DATA:  Post cardiac surgery.  EXAM: PORTABLE CHEST - 1 VIEW  COMPARISON:  12/30/2013.  FINDINGS: Swan-Ganz catheter has been removed. Introducer remains in place with the tip at the level of the proximal superior vena cava  Left-sided chest tube and mediastinal drains have been removed. No gross pneumothorax. Questionable tiny left apical pneumothorax may represent reflection of rib rather than pneumothorax. Attention to this on follow up.  Bibasilar subsegmental atelectasis and possible small pleural effusions.  Post CABG. Heart silhouette difficult adequately assessed secondary poor inspiration and elevated hemidiaphragms.  Mildly tortuous aorta.  IMPRESSION: Left-sided chest tube and mediastinal drains have been removed. No gross pneumothorax. Questionable tiny left apical pneumothorax may represent reflection of rib rather than pneumothorax. Attention to this on follow up.  Bibasilar subsegmental atelectasis and possible small pleural effusions.   Electronically Signed   By: Bridgett Larsson M.D.   On: 12/31/2013 07:57   Discharge Orders   Future Appointments Provider Department Dept Phone   01/20/2014 10:00 AM Marykay Lex, MD [facility]  [phone]   01/26/2014 10:30 AM Alleen Borne, MD [facility] [phone]   Future Orders Complete By Expires   Amb Referral to Cardiac Rehabilitation  As directed      Discharge Medications:  The patient has been discharged on:   Medication List         aspirin 325 MG EC tablet  Take 1 tablet (325 mg total) by mouth daily.     atorvastatin 80 MG tablet  Commonly known as:  LIPITOR  Take 1 tablet (80 mg total) by mouth daily at 6 PM.     ibuprofen 200 MG tablet  Commonly known as:  ADVIL,MOTRIN  Take 200 mg by mouth every 6 (six) hours as needed for mild pain.      levothyroxine 50 MCG tablet  Commonly known as:  SYNTHROID, LEVOTHROID  Take 50 mcg by mouth daily before breakfast.     lisinopril 2.5 MG tablet  Commonly known as:  PRINIVIL,ZESTRIL  Take 1 tablet (2.5 mg total) by mouth daily.     metoprolol tartrate 25 MG tablet  Commonly known as:  LOPRESSOR  Take 1 tablet (25 mg total) by mouth 2 (two) times daily.     nicotine 21 mg/24hr patch  Commonly known as:  NICODERM CQ  Place 1 patch (21 mg total) onto the skin daily as needed.     oxyCODONE 5 MG immediate release tablet  Commonly known as:  Oxy IR/ROXICODONE  Take 1-2 tablets (5-10 mg total) by mouth every 4 (four) hours as needed for moderate pain.     traZODone 50 MG tablet  Commonly known as:  DESYREL  Take 150 mg by mouth at bedtime.        1.Beta Blocker:  Yes [  x ]                              No   [   ]                              If No, reason:  2.Ace Inhibitor/ARB: Yes [ x  ]                                     No  [    ]                                     If No, reason:  3.Statin:   Yes [  x ]                  No  [   ]                  If No, reason:  4.Ecasa:  Yes  [ x  ]                  No   [   ]                  If No, reason:  Follow Up Appointments: Follow-up Information   Follow up with Marykay Lex, MD On 01/20/2014. (Appointment time is at 10:00 am)    Specialty:  Cardiology  Contact information:   [address] [place] [address] [phone]       Follow up with Alleen Borne, MD On 01/26/2014. (PA/LAT CXR to be taken (at [facility] which is in the same building as Dr. Sharee Pimple office) on 01/26/2014 at 9:30 am;Appointment with Dr. Laneta Simmers is on 01/26/2014 at 10:30 am)    Specialty:  Cardiothoracic Surgery   Contact information:   [address] [place] [address] [phone]       Follow up with Thora Lance, MD. (Call for a follow up appointment regarding further surveillance HGA2C  5.9)    Specialty:  Family Medicine   Contact information:   [address], [address] [place] [address] [phone]       Signed: Doree Fudge MPA-C 01/02/2014, 7:52 AM

## 2013-12-31 NOTE — Discharge Instructions (Signed)
Activity: 1.May walk up steps                2.No lifting more than ten pounds for four weeks.                 3.No driving for four weeks.                4.Stop any activity that causes chest pain, shortness of breath, dizziness, sweating or excessive weakness.                5.Avoid straining.                6.Continue with your breathing exercises daily.  Diet: Low fat, Low salt, and low sugar diet  Wound Care: May shower.  Clean wounds with mild soap and water daily. Contact the office at [phone] if any problems arise.  Coronary Artery Bypass Grafting, Care After Refer to this sheet in the next few weeks. These instructions provide you with information on caring for yourself after your procedure. Your health care provider may also give you more specific instructions. Your treatment has been planned according to current medical practices, but problems sometimes occur. Call your health care provider if you have any problems or questions after your procedure. WHAT TO EXPECT AFTER THE PROCEDURE Recovery from surgery will be different for everyone. Some people feel well after 3 or 4 weeks, while for others it takes longer. After your procedure, it is typical to have the following:  Nausea and a lack of appetite.   Constipation.  Weakness and fatigue.   Depression or irritability.   Pain or discomfort at your incision site. HOME CARE INSTRUCTIONS  Only take over-the-counter or prescription medicines as directed by your health care provider. Take all medicines exactly as directed. Do not stop taking medicines or start any new medicines without first checking with your health care provider.   Take your pulse as directed by your health care provider.  Perform deep breathing as directed by your health care provider. If you were given a device called an incentive spirometer, use it to practice deep breathing several times a day. Support your chest with a pillow or your arms when you  take deep breaths or cough.  Keep incision areas clean, dry, and protected. Remove or change any bandages (dressings) only as directed by your health care provider. You may have skin adhesive strips over the incision areas. Do not take the strips off. They will fall off on their own.  Check incision areas daily for any swelling, redness, or drainage.  If incisions were made in your legs, do the following:  Avoid crossing your legs.   Avoid sitting for long periods of time. Change positions every 30 minutes.   Elevate your legs when you are sitting.   Wear compression stockings as directed by your health care provider. These stockings help keep blood clots from forming in your legs.  Take showers once your health care provider approves. Until then, only take sponge baths. Pat incisions dry. Do not rub incisions with a washcloth or towel. Do not take tub baths or go swimming until your health care provider approves.  Eat foods that are high in fiber, such as raw fruits and vegetables, whole grains, beans, and nuts. Meats should be lean cut. Avoid canned, processed, and fried foods.  Drink enough fluids to keep your urine clear or pale yellow.  Weigh yourself every day. This helps identify if you are  retaining fluid that may make your heart and lungs work harder.   Rest and limit activity as directed by your health care provider. You may be instructed to:  Stop any activity at once if you have chest pain, shortness of breath, irregular heartbeats, or dizziness. Get help right away if you have any of these symptoms.  Move around frequently for short periods or take short walks as directed by your health care provider. Increase your activities gradually. You may need physical therapy or cardiac rehabilitation to help strengthen your muscles and build your endurance.  Avoid lifting, pushing, or pulling anything heavier than 10 lb (4.5 kg) for at least 6 weeks after surgery.  Do not  drive until your health care provider approves.  Ask your health care provider when you may return to work and resume sexual activity.  Follow up with your health care provider as directed.  SEEK MEDICAL CARE IF:  You have swelling, redness, increasing pain, or drainage at the site of an incision.   You develop a fever.   You have swelling in your ankles or legs.   You have pain in your legs.   You have weight gain of 2 or more pounds a day.  You are nauseous or vomit.  You have diarrhea. SEEK IMMEDIATE MEDICAL CARE IF:  You have chest pain that goes to your jaw or arms.  You have shortness of breath.   You have a fast or irregular heartbeat.   You notice a "clicking" in your breastbone (sternum) when you move.   You have numbness or weakness in your arms or legs.  You feel dizzy or lightheaded.  MAKE SURE YOU:  Understand these instructions.  Will watch your condition.  Will get help right away if you are not doing well or get worse. Document Released: 07/05/2005 Document Revised: 08/18/2013 Document Reviewed: 05/25/2013 [facility] Patient Information 2014 [place].

## 2013-12-31 NOTE — Progress Notes (Signed)
Pt. Seen and examined. Agree with the NP/PA-C note as written.  Progressing well. Some constipation today. Diuresing.  EKG stable. Agree with adding ACE-I or ARB prior to discharge. Probably ok for floor transfer today per TCTS.  Chrystie NoseKenneth C. Hilty, MD, [facility]ACC Attending Cardiologist [facility]

## 2014-01-01 MED ORDER — FUROSEMIDE 10 MG/ML IJ SOLN
40.0000 mg | Freq: Once | INTRAMUSCULAR | Status: AC
  Administered 2014-01-01: 40 mg via INTRAVENOUS
  Filled 2014-01-01: qty 4

## 2014-01-01 MED ORDER — HYDROCOD POLST-CHLORPHEN POLST 10-8 MG/5ML PO LQCR: 5.0000 mL | Freq: Two times a day (BID) | ORAL | Status: DC | PRN

## 2014-01-01 MED ORDER — FUROSEMIDE 40 MG PO TABS
40.0000 mg | ORAL_TABLET | Freq: Every day | ORAL | Status: DC
  Administered 2014-01-02 – 2014-01-03 (×2): 40 mg via ORAL
  Filled 2014-01-01 (×2): qty 1

## 2014-01-01 MED ORDER — POLYETHYLENE GLYCOL 3350 17 G PO PACK
17.0000 g | PACK | Freq: Once | ORAL | Status: AC
  Administered 2014-01-01: 17 g via ORAL
  Filled 2014-01-01: qty 1

## 2014-01-01 NOTE — Progress Notes (Addendum)
      [address]       [place] [id number]             [phone]        3 Days Post-Op Procedure(s) (LRB): CORONARY ARTERY BYPASS GRAFTING (CABG) (N/A) INTRAOPERATIVE TRANSESOPHAGEAL ECHOCARDIOGRAM (N/A)  Subjective: Patient with "coughing spell" this morning. At times, cough is productive. Passing a lot of gas but no bowel movement yet.  Objective: Vital signs in last 24 hours: Temp:  [98 F (36.7 C)-99.4 F (37.4 C)] 98.7 F (37.1 C) (01/03 0537) Pulse Rate:  [86-105] 97 (01/03 0537) Cardiac Rhythm:  [-] Normal sinus rhythm (01/03 0756) Resp:  [15-26] 17 (01/03 0537) BP: (106-126)/(54-79) 126/79 mmHg (01/03 0537) SpO2:  [91 %-97 %] 92 % (01/03 0537) Weight:  [95.1 kg (209 lb 10.5 oz)] 95.1 kg (209 lb 10.5 oz) (01/03 0537)  Pre op weight  91.1 kg Current Weight  01/01/14 95.1 kg (209 lb 10.5 oz)      Intake/Output from previous day: 01/02 0701 - 01/03 0700 In: 180 [P.O.:180] Out: -    Physical Exam:  Cardiovascular: RRR, no murmurs, gallops, or rubs. Pulmonary: Slightly diminished at bases; no rales, wheezes, or rhonchi. Abdomen: Soft, non tender, bowel sounds present. Extremities: Mild bilateral lower extremity edema. Wounds: Clean and dry.  No erythema or signs of infection.  Lab Results: CBC: Recent Labs  12/30/13 1615 12/30/13 1632 12/31/13 0425  WBC 10.0  --  10.9*  HGB 12.1* 12.6* 11.3*  HCT 34.9* 37.0* 33.1*  PLT 149*  --  137*   BMET:  Recent Labs  12/30/13 0414  12/30/13 1632 12/31/13 0425  NA 138  --  135* 134*  K 4.5  --  4.3 4.0  CL 103  --  94* 97  CO2 25  --   --  31  GLUCOSE 106*  --  122* 132*  BUN 14  --  14 17  CREATININE 0.80  < > 1.00 0.85  CALCIUM 7.9*  --   --  8.1*  < > = values in this interval not displayed.  PT/INR:  Lab Results  Component Value Date   INR 1.32 12/29/2013   INR 0.97 12/26/2013   ABG:  INR: Will add last result for INR, ABG once components are confirmed Will add last 4  CBG results once components are confirmed  Assessment/Plan:  1. CV - SR in the 90's this am. On Lopressor 12.5 bid. Will start low dose Lisinopril. 2.  Pulmonary - Encourage incentive spirometer 3. Volume Overload - On Lasix 40 daily. Will give IV this am 4.  Acute blood loss anemia - Last H and H 11.3 and 33.1 5.Mild thrombocytopenia-last platelets 137,000 6. Miralax for constipation 7. Will try Tussionex for cough and stop Mucinex 8.Remove EPW in am 9. Possible discharge 1-2 days  ZIMMERMAN,DONIELLE MPA-C 01/01/2014,8:07 AM  Repeat chest x-ray in a.m.  patient examined and medical record reviewed,agree with above note. VAN TRIGT III,PETER 01/01/2014

## 2014-01-01 NOTE — Progress Notes (Signed)
DAILY PROGRESS NOTE  Subjective:  Continues to improve. Still constipated. Interested in cardiac rehab.  Objective:  Temp:  [98 F (36.7 C)-99.4 F (37.4 C)] 98.7 F (37.1 C) (01/03 0537) Pulse Rate:  [86-105] 97 (01/03 0537) Resp:  [15-26] 17 (01/03 0537) BP: (106-126)/(55-79) 126/79 mmHg (01/03 0537) SpO2:  [91 %-97 %] 92 % (01/03 0537) Weight:  [209 lb 10.5 oz (95.1 kg)] 209 lb 10.5 oz (95.1 kg) (01/03 0537) Weight change: -3 lb 4.9 oz (-1.5 kg)  Intake/Output from previous day: 01/02 0701 - 01/03 0700 In: 180 [P.O.:180] Out: -   Intake/Output from this shift:    Medications: Current Facility-Administered Medications  Medication Dose Route Frequency Provider Last Rate Last Dose  . 0.45 % sodium chloride infusion   Intravenous Continuous Wayne E Gold, PA-C      . 0.9 %  sodium chloride infusion   Intravenous Continuous Wayne E Gold, PA-C      . 0.9 %  sodium chloride infusion  250 mL Intravenous Continuous Wayne E Gold, PA-C      . acetaminophen (TYLENOL) tablet 1,000 mg  1,000 mg Oral Q6H Wayne E Gold, PA-C   1,000 mg at 01/01/14 0258   Or  . acetaminophen (TYLENOL) solution 1,000 mg  1,000 mg Per Tube Q6H Wayne E Gold, PA-C      . aspirin EC tablet 325 mg  325 mg Oral Daily John Giovanni, PA-C   325 mg at 01/01/14 1054   Or  . aspirin chewable tablet 324 mg  324 mg Per Tube Daily Wayne E Gold, PA-C   324 mg at 12/31/13 0920  . atorvastatin (LIPITOR) tablet 80 mg  80 mg Oral q1800 Wayne E Gold, PA-C   80 mg at 12/31/13 1801  . bisacodyl (DULCOLAX) EC tablet 10 mg  10 mg Oral Daily John Giovanni, PA-C   10 mg at 01/01/14 1055   Or  . bisacodyl (DULCOLAX) suppository 10 mg  10 mg Rectal Daily Wayne E Gold, PA-C      . chlorpheniramine-HYDROcodone (TUSSIONEX) 10-8 MG/5ML suspension 5 mL  5 mL Oral Q12H PRN Donielle Liston Alba, PA-C      . docusate sodium (COLACE) capsule 200 mg  200 mg Oral Daily Wayne E Gold, PA-C   200 mg at 01/01/14 1054  . enoxaparin (LOVENOX)  injection 40 mg  40 mg Subcutaneous QHS Gaye Pollack, MD   40 mg at 12/31/13 2028  . fluticasone (FLONASE) 50 MCG/ACT nasal spray 2 spray  2 spray Each Nare Daily Gaye Pollack, MD   2 spray at 01/01/14 1056  . [START ON 01/02/2014] furosemide (LASIX) tablet 40 mg  40 mg Oral Daily Donielle Liston Alba, PA-C      . ketorolac (TORADOL) 15 MG/ML injection 15 mg  15 mg Intravenous Q6H PRN Gaye Pollack, MD   15 mg at 12/30/13 1825  . lactated ringers infusion   Intravenous Continuous Wayne E Gold, PA-C      . levothyroxine (SYNTHROID, LEVOTHROID) tablet 50 mcg  50 mcg Oral QAC breakfast Leonie Man, MD   50 mcg at 01/01/14 0754  . metoprolol (LOPRESSOR) injection 2.5-5 mg  2.5-5 mg Intravenous Q2H PRN Wayne E Gold, PA-C      . metoprolol tartrate (LOPRESSOR) tablet 12.5 mg  12.5 mg Oral BID Wayne E Gold, PA-C   12.5 mg at 01/01/14 1054   Or  . metoprolol tartrate (LOPRESSOR) 25 mg/10 mL oral suspension 12.5  mg  12.5 mg Per Tube BID John Giovanni, PA-C   12.5 mg at 12/31/13 6962  . morphine 2 MG/ML injection 2-5 mg  2-5 mg Intravenous Q1H PRN John Giovanni, PA-C   2 mg at 12/30/13 1257  . nicotine (NICODERM CQ - dosed in mg/24 hours) patch 21 mg  21 mg Transdermal Daily Gaye Pollack, MD   21 mg at 01/01/14 1057  . ondansetron (ZOFRAN) injection 4 mg  4 mg Intravenous Q6H PRN Wayne E Gold, PA-C      . oxyCODONE (Oxy IR/ROXICODONE) immediate release tablet 5-10 mg  5-10 mg Oral Q3H PRN Wilder Glade Gold, PA-C   10 mg at 01/01/14 0754  . pantoprazole (PROTONIX) EC tablet 40 mg  40 mg Oral Daily Wayne E Gold, PA-C   40 mg at 01/01/14 1055  . polyethylene glycol (MIRALAX / GLYCOLAX) packet 17 g  17 g Oral Once Donielle M Zimmerman, PA-C      . potassium chloride SA (K-DUR,KLOR-CON) CR tablet 20 mEq  20 mEq Oral Daily Donielle Liston Alba, PA-C   20 mEq at 01/01/14 1055  . sodium chloride 0.9 % injection 3 mL  3 mL Intravenous Q12H Wayne E Gold, PA-C   3 mL at 01/01/14 1057  . sodium chloride 0.9 %  injection 3 mL  3 mL Intravenous PRN John Giovanni, PA-C        Physical Exam: General appearance: alert and no distress Lungs: clear to auscultation bilaterally Heart: regular rate and rhythm, S1, S2 normal, no murmur, click, rub or gallop Extremities: extremities normal, atraumatic, no cyanosis or edema  Lab Results: Results for orders placed during the hospital encounter of 12/26/13 (from the past 48 hour(s))  GLUCOSE, CAPILLARY     Status: Abnormal   Collection Time    12/30/13  4:07 PM      Result Value Range   Glucose-Capillary 102 (*) 70 - 99 mg/dL   Comment 1 Notify RN    MAGNESIUM     Status: None   Collection Time    12/30/13  4:15 PM      Result Value Range   Magnesium 1.8  1.5 - 2.5 mg/dL  CBC     Status: Abnormal   Collection Time    12/30/13  4:15 PM      Result Value Range   WBC 10.0  4.0 - 10.5 K/uL   RBC 3.65 (*) 4.22 - 5.81 MIL/uL   Hemoglobin 12.1 (*) 13.0 - 17.0 g/dL   HCT 34.9 (*) 39.0 - 52.0 %   MCV 95.6  78.0 - 100.0 fL   MCH 33.2  26.0 - 34.0 pg   MCHC 34.7  30.0 - 36.0 g/dL   RDW 12.7  11.5 - 15.5 %   Platelets 149 (*) 150 - 400 K/uL  CREATININE, SERUM     Status: None   Collection Time    12/30/13  4:15 PM      Result Value Range   Creatinine, Ser 0.92  0.50 - 1.35 mg/dL   GFR calc non Af Amer >90  >90 mL/min   GFR calc Af Amer >90  >90 mL/min   Comment: (NOTE)     The eGFR has been calculated using the CKD EPI equation.     This calculation has not been validated in all clinical situations.     eGFR's persistently <90 mL/min signify possible Chronic Kidney     Disease.  POCT I-STAT, CHEM 8  Status: Abnormal   Collection Time    12/30/13  4:32 PM      Result Value Range   Sodium 135 (*) 137 - 147 mEq/L   Potassium 4.3  3.7 - 5.3 mEq/L   Chloride 94 (*) 96 - 112 mEq/L   BUN 14  6 - 23 mg/dL   Creatinine, Ser 1.00  0.50 - 1.35 mg/dL   Glucose, Bld 122 (*) 70 - 99 mg/dL   Calcium, Ion 1.23  1.12 - 1.23 mmol/L   TCO2 29  0 - 100 mmol/L    Hemoglobin 12.6 (*) 13.0 - 17.0 g/dL   HCT 37.0 (*) 39.0 - 52.0 %  GLUCOSE, CAPILLARY     Status: Abnormal   Collection Time    12/30/13  8:26 PM      Result Value Range   Glucose-Capillary 106 (*) 70 - 99 mg/dL   Comment 1 Documented in Chart     Comment 2 Notify RN    GLUCOSE, CAPILLARY     Status: Abnormal   Collection Time    12/30/13 11:03 PM      Result Value Range   Glucose-Capillary 108 (*) 70 - 99 mg/dL   Comment 1 Documented in Chart     Comment 2 Notify RN    BASIC METABOLIC PANEL     Status: Abnormal   Collection Time    12/31/13  4:25 AM      Result Value Range   Sodium 134 (*) 137 - 147 mEq/L   Comment: Please note change in reference range.   Potassium 4.0  3.7 - 5.3 mEq/L   Comment: Please note change in reference range.   Chloride 97  96 - 112 mEq/L   CO2 31  19 - 32 mEq/L   Glucose, Bld 132 (*) 70 - 99 mg/dL   BUN 17  6 - 23 mg/dL   Creatinine, Ser 0.85  0.50 - 1.35 mg/dL   Calcium 8.1 (*) 8.4 - 10.5 mg/dL   GFR calc non Af Amer >90  >90 mL/min   GFR calc Af Amer >90  >90 mL/min   Comment: (NOTE)     The eGFR has been calculated using the CKD EPI equation.     This calculation has not been validated in all clinical situations.     eGFR's persistently <90 mL/min signify possible Chronic Kidney     Disease.  CBC     Status: Abnormal   Collection Time    12/31/13  4:25 AM      Result Value Range   WBC 10.9 (*) 4.0 - 10.5 K/uL   RBC 3.42 (*) 4.22 - 5.81 MIL/uL   Hemoglobin 11.3 (*) 13.0 - 17.0 g/dL   HCT 33.1 (*) 39.0 - 52.0 %   MCV 96.8  78.0 - 100.0 fL   MCH 33.0  26.0 - 34.0 pg   MCHC 34.1  30.0 - 36.0 g/dL   RDW 12.6  11.5 - 15.5 %   Platelets 137 (*) 150 - 400 K/uL  GLUCOSE, CAPILLARY     Status: Abnormal   Collection Time    12/31/13  4:26 AM      Result Value Range   Glucose-Capillary 117 (*) 70 - 99 mg/dL  GLUCOSE, CAPILLARY     Status: Abnormal   Collection Time    12/31/13  8:26 AM      Result Value Range   Glucose-Capillary 113  (*) 70 - 99 mg/dL   Comment 1  Notify RN     Comment 2 Documented in Chart    GLUCOSE, CAPILLARY     Status: None   Collection Time    12/31/13 12:18 PM      Result Value Range   Glucose-Capillary 99  70 - 99 mg/dL   Comment 1 Documented in Chart     Comment 2 Notify RN      Imaging: Dg Chest Port 1 View  12/31/2013   CLINICAL DATA:  Post cardiac surgery.  EXAM: PORTABLE CHEST - 1 VIEW  COMPARISON:  12/30/2013.  FINDINGS: Swan-Ganz catheter has been removed. Introducer remains in place with the tip at the level of the proximal superior vena cava  Left-sided chest tube and mediastinal drains have been removed. No gross pneumothorax. Questionable tiny left apical pneumothorax may represent reflection of rib rather than pneumothorax. Attention to this on follow up.  Bibasilar subsegmental atelectasis and possible small pleural effusions.  Post CABG. Heart silhouette difficult adequately assessed secondary poor inspiration and elevated hemidiaphragms.  Mildly tortuous aorta.  IMPRESSION: Left-sided chest tube and mediastinal drains have been removed. No gross pneumothorax. Questionable tiny left apical pneumothorax may represent reflection of rib rather than pneumothorax. Attention to this on follow up.  Bibasilar subsegmental atelectasis and possible small pleural effusions.   Electronically Signed   By: Chauncey Cruel M.D.   On: 12/31/2013 07:57    Assessment:  1. Principal Problem: 2.   Non-STEMI (non-ST elevated myocardial infarction) 3. Active Problems: 4.   Tobacco abuse 5.   Dextrocardia 6.   Dyslipidemia 7.   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  8.   S/P CABG x 3 9.   Plan:  1. Agree with starting ACE-I today. Continue to ambulate. Agree that he may still be somewhat volume overloaded. Additional lasix today.   Time Spent Directly with Patient:  15 minutes  Length of Stay:  LOS: 6 days   Pixie Casino, MD, [facility] Attending Cardiologist [facility]  Maleni Seyer C 01/01/2014, 11:48 AM

## 2014-01-01 NOTE — Progress Notes (Signed)
CARDIAC REHAB PHASE I   PRE:  Rate/Rhythm: 93 SR  BP:  Supine:   Sitting: 120/70  Standing:    SaO2: 92 RA  MODE:  Ambulation: 700 ft   POST:  Rate/Rhythm: 103 ST  BP:  Supine:   Sitting: 130/76  Standing:    SaO2: 93 RA  Tolerated ambulation well independently, he has actually been up in the hall on his own this morning.  Instructed to walk 2 more times today, IS encouraged, able to demonstrate with great results.  Education completed regarding sternal precautions, weight lifting limits, driving restrictions, signs and symptoms of an infection, when to call the doctor, and activity progression.  Referred to phase II cardiac rehab in [place], patient interested in enrolling.  Patient states "I am finished with smoking, explained the importance of smoking cessation.   [id number]  Cindra EvesBehrens, Eliabeth Shoff Adele RN, BSN 01/01/2014 9:18 AM

## 2014-01-01 NOTE — Progress Notes (Signed)
Patient ambulated in hall by himself approximately 700 ft without difficulty. Tolerated well.

## 2014-01-02 ENCOUNTER — Inpatient Hospital Stay (HOSPITAL_COMMUNITY): Payer: PRIVATE HEALTH INSURANCE

## 2014-01-02 MED ORDER — LACTULOSE 10 GM/15ML PO SOLN
20.0000 g | Freq: Every day | ORAL | Status: DC | PRN
  Administered 2014-01-02: 20 g via ORAL
  Filled 2014-01-02 (×2): qty 30

## 2014-01-02 MED ORDER — LISINOPRIL 2.5 MG PO TABS
2.5000 mg | ORAL_TABLET | Freq: Every day | ORAL | Status: DC
  Administered 2014-01-02 – 2014-01-03 (×2): 2.5 mg via ORAL
  Filled 2014-01-02 (×2): qty 1

## 2014-01-02 MED ORDER — METOPROLOL TARTRATE 25 MG PO TABS
25.0000 mg | ORAL_TABLET | Freq: Two times a day (BID) | ORAL | Status: DC
  Administered 2014-01-02 – 2014-01-03 (×3): 25 mg via ORAL
  Filled 2014-01-02 (×4): qty 1

## 2014-01-02 MED ORDER — METOPROLOL TARTRATE 25 MG/10 ML ORAL SUSPENSION
25.0000 mg | Freq: Two times a day (BID) | ORAL | Status: DC
  Filled 2014-01-02 (×4): qty 10

## 2014-01-02 MED ORDER — FLEET ENEMA 7-19 GM/118ML RE ENEM
1.0000 | ENEMA | Freq: Once | RECTAL | Status: DC
  Filled 2014-01-02: qty 1

## 2014-01-02 NOTE — Progress Notes (Addendum)
      [address]       [place] [id number]             [phone]        4 Days Post-Op Procedure(s) (LRB): CORONARY ARTERY BYPASS GRAFTING (CABG) (N/A) INTRAOPERATIVE TRANSESOPHAGEAL ECHOCARDIOGRAM (N/A)  Subjective: Patient still with no bowel movement, but continues to pass flatus. Tussionex is helping cough.  Objective: Vital signs in last 24 hours: Temp:  [98.5 F (36.9 C)-99.7 F (37.6 C)] 98.8 F (37.1 C) (01/04 0527) Pulse Rate:  [90-102] 90 (01/04 0527) Cardiac Rhythm:  [-] Normal sinus rhythm (01/03 1940) Resp:  [18] 18 (01/04 0527) BP: (114-120)/(72-74) 120/72 mmHg (01/04 0527) SpO2:  [93 %-94 %] 93 % (01/04 0527) Weight:  [93.1 kg (205 lb 4 oz)] 93.1 kg (205 lb 4 oz) (01/04 0527)  Pre op weight  91.1 kg Current Weight  01/02/14 93.1 kg (205 lb 4 oz)      Intake/Output from previous day: 01/03 0701 - 01/04 0700 In: 240 [P.O.:240] Out: 200 [Urine:200]   Physical Exam:  Cardiovascular: RRR, no murmurs, gallops, or rubs. Pulmonary: Mostly clear; no rales, wheezes, or rhonchi. Abdomen: Soft, non tender, slight distention,bowel sounds present. Extremities:Trace bilateral lower extremity edema. Wounds: Sternal wound with serosanguinous below midline. No erythema. RLE wound clean and dry.  Lab Results: CBC:  Recent Labs  12/30/13 1615 12/30/13 1632 12/31/13 0425  WBC 10.0  --  10.9*  HGB 12.1* 12.6* 11.3*  HCT 34.9* 37.0* 33.1*  PLT 149*  --  137*   BMET:   Recent Labs  12/30/13 1632 12/31/13 0425  NA 135* 134*  K 4.3 4.0  CL 94* 97  CO2  --  31  GLUCOSE 122* 132*  BUN 14 17  CREATININE 1.00 0.85  CALCIUM  --  8.1*    PT/INR:  Lab Results  Component Value Date   INR 1.32 12/29/2013   INR 0.97 12/26/2013   ABG:  INR: Will add last result for INR, ABG once components are confirmed Will add last 4 CBG results once components are confirmed  Assessment/Plan:  1. CV - ST and PVC's this am. On Lopressor 12.5  bid. Will increase to 25 bid and start low dose Lisinopril.  2.  Pulmonary - CXR this am appears to show no pneumothorax, bibasilar atelectasis R>L, and small right pleural effusion.Encourage incentive spirometer 3. Volume Overload - On Lasix 40 daily.  4.  Acute blood loss anemia - Last H and H 11.3 and 33.1 5.Mild thrombocytopenia-last platelets 137,000 6.Remove EPW  7.Enema for constipation 8.Monitor sternal drainage. No signs of infection so will not start antibiotics at this time. 9. Possible discharge in am  ZIMMERMAN,DONIELLE MPA-C 01/02/2014,7:37 AM   Patient examined and medical record viewed Patient appears to be progressing well and with adjustment in medication should be ready for discharge home. Serous drainage from surgical incision will improve with diuresis  patient examined and medical record reviewed,agree with above note. VAN TRIGT III,PETER 01/02/2014

## 2014-01-02 NOTE — Progress Notes (Addendum)
EPW removed per MD orders and hospital policy; EPW intact; patient on bedrest for one hour; Q15 vitals; call bell within reach of patient; will continue to monitor.  BARNETT, Geroge BasemanHEATHER M

## 2014-01-02 NOTE — Progress Notes (Signed)
DAILY PROGRESS NOTE  Subjective:  No events overnight. PVC's noted on monitor.  Objective:  Temp:  [98.5 F (36.9 C)-99.7 F (37.6 C)] 98.8 F (37.1 C) (01/04 0527) Pulse Rate:  [62-102] 84 (01/04 0846) Resp:  [18] 18 (01/04 0527) BP: (95-126)/(63-79) 113/75 mmHg (01/04 0846) SpO2:  [93 %-94 %] 93 % (01/04 0527) Weight:  [205 lb 4 oz (93.1 kg)] 205 lb 4 oz (93.1 kg) (01/04 0527) Weight change: -4 lb 6.6 oz (-2 kg)  Intake/Output from previous day: 01/03 0701 - 01/04 0700 In: 240 [P.O.:240] Out: 200 [Urine:200]  Intake/Output from this shift:    Medications: Current Facility-Administered Medications  Medication Dose Route Frequency Provider Last Rate Last Dose  . 0.45 % sodium chloride infusion   Intravenous Continuous Wayne E Gold, PA-C      . 0.9 %  sodium chloride infusion   Intravenous Continuous Wayne E Gold, PA-C      . 0.9 %  sodium chloride infusion  250 mL Intravenous Continuous Wayne E Gold, PA-C      . acetaminophen (TYLENOL) tablet 1,000 mg  1,000 mg Oral Q6H Wayne E Gold, PA-C   1,000 mg at 01/02/14 0532   Or  . acetaminophen (TYLENOL) solution 1,000 mg  1,000 mg Per Tube Q6H Wayne E Gold, PA-C      . aspirin EC tablet 325 mg  325 mg Oral Daily Rowe ClackWayne E Gold, PA-C   325 mg at 01/01/14 1054   Or  . aspirin chewable tablet 324 mg  324 mg Per Tube Daily Wayne E Gold, PA-C   324 mg at 01/02/14 0945  . atorvastatin (LIPITOR) tablet 80 mg  80 mg Oral q1800 Wayne E Gold, PA-C   80 mg at 01/01/14 1655  . bisacodyl (DULCOLAX) EC tablet 10 mg  10 mg Oral Daily Rowe ClackWayne E Gold, PA-C   10 mg at 01/02/14 [id number]   Or  . bisacodyl (DULCOLAX) suppository 10 mg  10 mg Rectal Daily Wayne E Gold, PA-C      . chlorpheniramine-HYDROcodone (TUSSIONEX) 10-8 MG/5ML suspension 5 mL  5 mL Oral Q12H PRN Donielle Margaretann LovelessM Zimmerman, PA-C      . docusate sodium (COLACE) capsule 200 mg  200 mg Oral Daily Wayne E Gold, PA-C   200 mg at 01/02/14 0949  . enoxaparin (LOVENOX) injection 40 mg  40 mg  Subcutaneous QHS Alleen BorneBryan K Bartle, MD   40 mg at 01/01/14 2136  . fluticasone (FLONASE) 50 MCG/ACT nasal spray 2 spray  2 spray Each Nare Daily Alleen BorneBryan K Bartle, MD   2 spray at 01/02/14 0947  . furosemide (LASIX) tablet 40 mg  40 mg Oral Daily Ardelle Ballsonielle M Zimmerman, PA-C   40 mg at 01/02/14 0949  . ketorolac (TORADOL) 15 MG/ML injection 15 mg  15 mg Intravenous Q6H PRN Alleen BorneBryan K Bartle, MD   15 mg at 12/30/13 1825  . lactated ringers infusion   Intravenous Continuous Wayne E Gold, PA-C      . levothyroxine (SYNTHROID, LEVOTHROID) tablet 50 mcg  50 mcg Oral QAC breakfast Marykay Lexavid W Harding, MD   50 mcg at 01/02/14 [phone]  . lisinopril (PRINIVIL,ZESTRIL) tablet 2.5 mg  2.5 mg Oral Daily Ardelle BallsDonielle M Zimmerman, PA-C   2.5 mg at 01/02/14 0947  . metoprolol (LOPRESSOR) injection 2.5-5 mg  2.5-5 mg Intravenous Q2H PRN Wayne E Gold, PA-C      . metoprolol tartrate (LOPRESSOR) tablet 25 mg  25 mg Oral BID Ardelle Ballsonielle M Zimmerman, PA-C  25 mg at 01/02/14 0947   Or  . metoprolol tartrate (LOPRESSOR) 25 mg/10 mL oral suspension 25 mg  25 mg Per Tube BID Ardelle Balls, PA-C      . morphine 2 MG/ML injection 2-5 mg  2-5 mg Intravenous Q1H PRN Rowe Clack, PA-C   2 mg at 12/30/13 1257  . nicotine (NICODERM CQ - dosed in mg/24 hours) patch 21 mg  21 mg Transdermal Daily Alleen Borne, MD   21 mg at 01/02/14 0949  . ondansetron (ZOFRAN) injection 4 mg  4 mg Intravenous Q6H PRN Wayne E Gold, PA-C      . oxyCODONE (Oxy IR/ROXICODONE) immediate release tablet 5-10 mg  5-10 mg Oral Q3H PRN Wayne E Gold, PA-C   10 mg at 01/02/14 0945  . pantoprazole (PROTONIX) EC tablet 40 mg  40 mg Oral Daily Rowe Clack, PA-C   40 mg at 01/02/14 0946  . potassium chloride SA (K-DUR,KLOR-CON) CR tablet 20 mEq  20 mEq Oral Daily Ardelle Balls, PA-C   20 mEq at 01/02/14 0946  . sodium chloride 0.9 % injection 3 mL  3 mL Intravenous Q12H Wayne E Gold, PA-C   3 mL at 01/02/14 0951  . sodium chloride 0.9 % injection 3 mL  3 mL  Intravenous PRN Wayne E Gold, PA-C      . sodium phosphate (FLEET) 7-19 GM/118ML enema 1 enema  1 enema Rectal Once Ardelle Balls, PA-C        Physical Exam: General appearance: alert and no distress Lungs: diminished breath sounds bilaterally Heart: regular rate and rhythm, S1, S2 normal, no murmur, click, rub or gallop Extremities: extremities normal, atraumatic, no cyanosis or edema  Lab Results: Results for orders placed during the hospital encounter of 12/26/13 (from the past 48 hour(s))  GLUCOSE, CAPILLARY     Status: None   Collection Time    12/31/13 12:18 PM      Result Value Range   Glucose-Capillary 99  70 - 99 mg/dL   Comment 1 Documented in Chart     Comment 2 Notify RN      Imaging: No results found.  Assessment:  1. Principal Problem: 2.   Non-STEMI (non-ST elevated myocardial infarction) 3. Active Problems: 4.   Tobacco abuse 5.   Dextrocardia 6.   Dyslipidemia 7.   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  8.   S/P CABG x 3 9.   Plan:  1. PVC's noted overnight. Agree with increase in b-blocker. BP may not tolerate ACE-I, but will see if he tolerates. Probable d/c tomorrow am.  Time Spent Directly with Patient:  15 minutes  Length of Stay:  LOS: 7 days   Chrystie Nose, MD, [facility] Attending Cardiologist [facility]  HILTY,Kenneth C 01/02/2014, 10:04 AM

## 2014-01-02 NOTE — Progress Notes (Signed)
Patient has ambulated X's 2 today; still waiting on BM; hyperactive bowel sounds; gave PRN lactulose; will continue to monitor.  BARNETT, Geroge BasemanHEATHER M

## 2014-01-03 ENCOUNTER — Encounter (HOSPITAL_COMMUNITY): Payer: Self-pay | Admitting: Surgery

## 2014-01-03 MED ORDER — OXYCODONE HCL 5 MG PO TABS: 5.0000 mg | ORAL_TABLET | ORAL | Status: DC | PRN

## 2014-01-03 MED ORDER — LISINOPRIL 2.5 MG PO TABS: 2.5000 mg | ORAL_TABLET | Freq: Every day | ORAL | Status: DC

## 2014-01-03 MED ORDER — ASPIRIN 325 MG PO TBEC: 325.0000 mg | DELAYED_RELEASE_TABLET | Freq: Every day | ORAL | Status: AC

## 2014-01-03 MED ORDER — NICOTINE 21 MG/24HR TD PT24: 21.0000 mg | MEDICATED_PATCH | Freq: Every day | TRANSDERMAL | Status: DC | PRN

## 2014-01-03 MED ORDER — METOPROLOL TARTRATE 25 MG PO TABS: 25.0000 mg | ORAL_TABLET | Freq: Two times a day (BID) | ORAL | Status: AC

## 2014-01-03 MED ORDER — ATORVASTATIN CALCIUM 80 MG PO TABS: 80.0000 mg | ORAL_TABLET | Freq: Every day | ORAL | Status: AC

## 2014-01-03 NOTE — Progress Notes (Signed)
01/03/2014 11:32 AM Nursing note Discharge avs form, medications already taken today and those due this evening given and explained to patient and family. Follow up appointments, activity restrictions and when to call MD reviewed. Rx and appointment cards also given to patient. Pt. Viewed video #113 and was given moving right along book prior to discharge. Contents reviewed. Cts d/c per orders and benzoin and steri strips applied to sites. D/c iv lines. D/c tele. D/c home with wife per orders.  Aislin Onofre, Blanchard KelchStephanie Ingold

## 2014-01-03 NOTE — Progress Notes (Signed)
CARDIAC REHAB PHASE I   PRE:  Rate/Rhythm: 97 SR with PVC    BP: sitting 89/75    SaO2: 92-93 RA  MODE:  Ambulation: 890 ft   POST:  Rate/Rhythm: 106 ST    BP: sitting 106/80     SaO2: 95 RA  Pt walking independently. Feels great. Reviewed ed, ex, smoking cessation. To watch video with wife when she arrives.  [phone]   Jason Roberts, Jason Roberts CES, ACSM 01/03/2014 9:07 AM

## 2014-01-03 NOTE — Progress Notes (Signed)
[address]       [place] [id number]             [phone]      5 Days Post-Op  Procedure(s) (LRB): CORONARY ARTERY BYPASS GRAFTING (CABG) (N/A) INTRAOPERATIVE TRANSESOPHAGEAL ECHOCARDIOGRAM (N/A) Subjective: Co new issues, feels well, + BM  Objective  Telemetry sinus rhythm   Temp:  [98.3 F (36.8 C)-98.5 F (36.9 C)] 98.3 F (36.8 C) (01/05 0536) Pulse Rate:  [62-98] 89 (01/05 0536) Resp:  [18] 18 (01/05 0536) BP: (95-126)/(55-79) 123/79 mmHg (01/05 0536) SpO2:  [94 %-96 %] 96 % (01/05 0536) Weight:  [202 lb 6.1 oz (91.8 kg)] 202 lb 6.1 oz (91.8 kg) (01/05 0536)   Intake/Output Summary (Last 24 hours) at 01/03/14 0736 Last data filed at 01/02/14 1432  Gross per 24 hour  Intake    480 ml  Output    200 ml  Net    280 ml       General appearance: alert, cooperative and no distress Heart: regular rate and rhythm Lungs: clear to auscultation bilaterally Abdomen: benign Extremities: trace edema Wound: incis healing well  Lab Results: No results found for this basename: NA, K, CL, CO2, GLUCOSE, BUN, CREATININE, CALCIUM, MG, PHOS,  in the last 72 hours No results found for this basename: AST, ALT, ALKPHOS, BILITOT, PROT, ALBUMIN,  in the last 72 hours No results found for this basename: LIPASE, AMYLASE,  in the last 72 hours No results found for this basename: WBC, NEUTROABS, HGB, HCT, MCV, PLT,  in the last 72 hours No results found for this basename: CKTOTAL, CKMB, TROPONINI,  in the last 72 hours No components found with this basename: POCBNP,  No results found for this basename: DDIMER,  in the last 72 hours No results found for this basename: HGBA1C,  in the last 72 hours No results found for this basename: CHOL, HDL, LDLCALC, TRIG, CHOLHDL,  in the last 72 hours No results found for this basename: TSH, T4TOTAL, FREET3, T3FREE, THYROIDAB,  in the last 72 hours No results found for this basename: VITAMINB12, FOLATE, FERRITIN, TIBC,  IRON, RETICCTPCT,  in the last 72 hours  Medications: Scheduled . acetaminophen  1,000 mg Oral Q6H   Or  . acetaminophen (TYLENOL) oral liquid 160 mg/5 mL  1,000 mg Per Tube Q6H  . aspirin EC  325 mg Oral Daily   Or  . aspirin  324 mg Per Tube Daily  . atorvastatin  80 mg Oral q1800  . bisacodyl  10 mg Oral Daily   Or  . bisacodyl  10 mg Rectal Daily  . docusate sodium  200 mg Oral Daily  . enoxaparin (LOVENOX) injection  40 mg Subcutaneous QHS  . fluticasone  2 spray Each Nare Daily  . furosemide  40 mg Oral Daily  . levothyroxine  50 mcg Oral QAC breakfast  . lisinopril  2.5 mg Oral Daily  . metoprolol tartrate  25 mg Oral BID   Or  . metoprolol tartrate  25 mg Per Tube BID  . nicotine  21 mg Transdermal Daily  . pantoprazole  40 mg Oral Daily  . potassium chloride  20 mEq Oral Daily  . sodium chloride  3 mL Intravenous Q12H  . sodium phosphate  1 enema Rectal Once     Radiology/Studies:  Dg Chest 2 View  01/02/2014   CLINICAL DATA:  Status post CABG.  EXAM: CHEST  2 VIEW  COMPARISON:  Chest x-ray 12/31/2013.  FINDINGS: Interval removal of right IJ central venous catheter. Lung volumes are low. Bibasilar opacities are favored to reflect subsegmental atelectasis. Trace bilateral pleural effusions. No evidence of pulmonary edema. Heart size is normal. The patient is rotated to the right on today's exam, resulting in distortion of the mediastinal contours and reduced diagnostic sensitivity and specificity for mediastinal pathology. Status post median sternotomy for CABG. Previously suspected tiny left apical pneumothorax is not confidently identified on today's study.  IMPRESSION: 1. Postoperative changes and support apparatus, as above. 2. Low lung volumes with minimal bibasilar subsegmental atelectasis trace bilateral pleural effusions. 3. No appreciable pneumothorax.   Electronically Signed   By: Trudie Reed M.D.   On: 01/02/2014 10:29    INR: Will add last result for INR,  ABG once components are confirmed Will add last 4 CBG results once components are confirmed  Assessment/Plan: S/P Procedure(s) (LRB): CORONARY ARTERY BYPASS GRAFTING (CABG) (N/A) INTRAOPERATIVE TRANSESOPHAGEAL ECHOCARDIOGRAM (N/A) Plan for discharge: see discharge orders   LOS: 8 days    GOLD,WAYNE E 1/5/20157:36 AM

## 2014-01-14 DIAGNOSIS — Z0279 Encounter for issue of other medical certificate: Secondary | ICD-10-CM

## 2014-01-18 ENCOUNTER — Telehealth: Payer: Self-pay | Admitting: Cardiology

## 2014-01-18 NOTE — Telephone Encounter (Signed)
Drop off some insurance papers here,wants to know the status of them please.

## 2014-01-18 NOTE — Telephone Encounter (Signed)
Returned call and pt verified x 2 w/ Gavin Poundeborah, pt's wife.  Informed message received and paperwork is on Dr. Elissa HeftyHarding's cart to review and sign.  Pt has appt on 1.22.15 per wife and informed they will likely be completed then.  Stated one needs to be faxed and informed that can be done at appt.  Verbalized understanding.

## 2014-01-20 ENCOUNTER — Telehealth: Payer: Self-pay | Admitting: *Deleted

## 2014-01-20 ENCOUNTER — Ambulatory Visit (INDEPENDENT_AMBULATORY_CARE_PROVIDER_SITE_OTHER): Payer: 59 | Admitting: Cardiology

## 2014-01-20 ENCOUNTER — Encounter: Payer: Self-pay | Admitting: Cardiology

## 2014-01-20 VITALS — BP 110/70 | HR 74 | Ht 70.0 in | Wt 204.4 lb

## 2014-01-20 DIAGNOSIS — Z720 Tobacco use: Secondary | ICD-10-CM

## 2014-01-20 DIAGNOSIS — I2510 Atherosclerotic heart disease of native coronary artery without angina pectoris: Secondary | ICD-10-CM

## 2014-01-20 DIAGNOSIS — E785 Hyperlipidemia, unspecified: Secondary | ICD-10-CM

## 2014-01-20 DIAGNOSIS — I214 Non-ST elevation (NSTEMI) myocardial infarction: Secondary | ICD-10-CM

## 2014-01-20 DIAGNOSIS — F17200 Nicotine dependence, unspecified, uncomplicated: Secondary | ICD-10-CM

## 2014-01-20 DIAGNOSIS — Z951 Presence of aortocoronary bypass graft: Secondary | ICD-10-CM

## 2014-01-20 DIAGNOSIS — E663 Overweight: Secondary | ICD-10-CM

## 2014-01-20 NOTE — Telephone Encounter (Signed)
Late entry. Mr Jason Roberts was in the office early today for post hospital visit. Last week FMLA form was left to to be completed.   FMLA FORM WAS COMPLETED AND ORIGINAL WAS GIVEN TO PATIENT AND A COPY WAS KEPT TO FILE IN [facility] E-CHART.

## 2014-01-20 NOTE — Patient Instructions (Signed)
May reduce aspirin to 81 mg a day  It will up to Dr Laneta SimmersBARTLE WHEN YOU RETURN TO WORK AND DRIVE.  Your physician wants you to follow-up in 3 MONTHS Dr Herbie BaltimoreHarding. You will receive a reminder letter in the mail two months in advance. If you don't receive a letter, please call our office to schedule the follow-up appointment.

## 2014-01-20 NOTE — Progress Notes (Signed)
PATIENT: Jason Roberts MRN: [id number]  DOB: 1956/03/18   DOV:01/22/2014 PCP: Thora Lance, MD  Clinic Note: Chief Complaint  Patient presents with  . Post CABG    C/o incisional pain/soreness, otherwise no complaints. One side of chest feels different than the other.   HPI: Jason Roberts is a 58 y.o.  male with a PMH below who presents today for initial postop followup after a non-STEMI/STEMI root resulting in CABG x3. He is a very pleasant gentleman who was admitted after several weeks of stuttering on and off chest pain and finally the pain did not go away. Initial evaluation suggested possible STEMI, however he was thought to not to the meet criteria for STEMI therefore he was postponed until this morning for cardiac catheterization where he showed actually have STEMI physiology with occluded RCA better pre-to be relatively fresh. Will the likely reason for the unusual EKG findings worse due to the fact that he had significant left right right collaterals. Despite this he also had significant lesions in the LAD and OM 3. Based on the stent of disease in the RCA, and the location of the disease in the LAD, it was thought to be best served by CABG. He was then referred to the vessel CABG. Interestingly, he carries a diagnosis of dextrocardia, but anatomically according to Dr. Laneta Simmers who did the surgery, he does not have true situs inversus type dextrocardia, the heart is just simply more centrally located in the chest..  His angina pain essentially started as indigestion that got worse and was associated with arm numbness and the radiating to the jaw and neck. Finally he came to the hospital and is centered in his chest and would not go away.  Interval History: Discharge she is doing quite well. He really hasn't had much limiting symptoms. He is a bit sore and has had some indigestion type symptoms but nothing like the indigestion that was associated with his angina pain. He denies any  PND, orthopnea or edema. No chest tightness or pressure with rest or exertion. No dizziness or exertion. He is doing a gradual increase in exercise as described by the cardiac rehabilitation folks in the hospital. He is waiting to restart cardiac rehabilitation after he sees Dr. Laneta Simmers. He is also due for followup with his primary care physician here in February to discuss his lipids. He actually says he feels fine that her knee hasn't for quite some time. He is a little sore but the breathing-wise and fatigued and improved significantly. He has feels a lot better having quit smoking as well. He quit on discharge and only use the patch for 3 more days. He denies any lightheadedness dizziness or wooziness. No rapid or irregular heartbeat/palpitations. No TIA or amaurosis fugax into the superior near syncope. No melena, hematochezia or hematuria. No claudication symptoms.  The remainder of Cardiovascular ROS: negative:   Past Medical History  Diagnosis Date  . Hemorrhoids   . 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  Marland Kitchen History of empyema of pleura      status post partial lung resection  . Non-STEMI (non-ST elevated myocardial infarction) 12/26/2013     Following 2 weeks of stuttering chest pain  . CAD in native artery 12/26/2013    Three-vessel disease with 100% occluded RCA (phyysioologically a STEMI), 90% OM 3, at present LAD. Extensive left right collaterals.  . 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 (Dr. Laneta Simmers)  . History of tobacco abuse     Quit during this hospitalization for MI.    Prior Cardiac Evaluation and Past Surgical History: Past Surgical History  Procedure Laterality Date  . Lll lung  1977    due to empyema  . Laminectomy    . Cardiac catheterization  12/28/2013    Early mid LAD 80% at takeoff of B1. Proximal OM 390%, 100 occluded RCA  . Coronary artery bypass graft N/A 12/29/2013    Procedure: CORONARY ARTERY BYPASS  GRAFTING (CABG);  Surgeon: Alleen Borne, MD;  Location: [facility] OR;  Service: Open Heart Surgery;  Laterality: N/A;  Times three  using left internal mammary artery and endoscopically harvested right saphenous vein  . Intraoperative transesophageal echocardiogram N/A 12/29/2013    Procedure: INTRAOPERATIVE TRANSESOPHAGEAL ECHOCARDIOGRAM;  Surgeon: Alleen Borne, MD;  Location: [facility] OR;  Service: Open Heart Surgery;  Laterality: N/A;  . Left arm chain saw injury    . Transthoracic echocardiogram  12/27/2013    DDS. Poor sternal 1 doesn't do to right deviation. Normal LV size and function. Otherwise essentially normal with the exception of the angle the heart.    Allergies  Allergen Reactions  . Penicillins Swelling    Current Outpatient Prescriptions  Medication Sig Dispense Refill  . aspirin EC 325 MG EC tablet Take 1 tablet (325 mg total) by mouth daily.      Marland Kitchen atorvastatin (LIPITOR) 80 MG tablet Take 1 tablet (80 mg total) by mouth daily at 6 PM.  30 tablet  1  . ibuprofen (ADVIL,MOTRIN) 200 MG tablet Take 200 mg by mouth every 6 (six) hours as needed for mild pain.      Marland Kitchen levothyroxine (SYNTHROID, LEVOTHROID) 50 MCG tablet Take 50 mcg by mouth daily before breakfast.      . lisinopril (PRINIVIL,ZESTRIL) 2.5 MG tablet Take 1 tablet (2.5 mg total) by mouth daily.  30 tablet  1  . metoprolol tartrate (LOPRESSOR) 25 MG tablet Take 1 tablet (25 mg total) by mouth 2 (two) times daily.  60 tablet  1  . oxyCODONE (OXY IR/ROXICODONE) 5 MG immediate release tablet Take 1-2 tablets (5-10 mg total) by mouth every 4 (four) hours as needed for moderate pain.  50 tablet  0  . traZODone (DESYREL) 50 MG tablet Take 150 mg by mouth at bedtime.       No current facility-administered medications for this visit.    History   Social History Narrative   He is married, and lives with his wife. He is a former smoker at least a pack a day for many years. He quit upon arrival to the emergency room. He does not  take alcohol. He does not use illicit drugs.    ROS: A comprehensive Review of Systems - Negative except Expected musculoskeletal pains in his chest from postop. He noted any significant weight loss from the hospital. He was to return 31 pounds upon arrival to the hospital is 204 pounds.  PHYSICAL EXAM BP 110/70  Pulse 74  Ht 5\' 10"  (1.778 m)  Wt 204 lb 6.4 oz (92.715 kg)  BMI 29.33 kg/m2 General appearance: alert, cooperative, appears stated age, no distress and He last rate with the weight loss. Well-nourished and well-groomed. Great mood and affect. Neck: no adenopathy, no carotid bruit, no JVD, supple, symmetrical, trachea midline and thyroid not enlarged, symmetric, no tenderness/mass/nodules Lungs: clear to auscultation bilaterally, normal percussion bilaterally and Nonlabored movement. Heart: regular rate and rhythm,  S1, S2 normal, no murmur, click, rub or gallop, normal apical impulse and Well-healing sternotomy scar.  His heart sounds are more located to the right than usual, however the apical impulse beat is not midclavicular on the right side is more almost substernal. Abdomen: soft, non-tender; bowel sounds normal; no masses,  no organomegaly Extremities: extremities normal, atraumatic, no cyanosis or edema Pulses: 2+ and symmetric Neurologic: Alert and oriented X 3, normal strength and tone. Normal symmetric reflexes. Normal coordination and gait  ZOX:[id number]:Performed today: Yes Rate: 74 , Rhythm: NSR; nonspecific T-wave abnormality. Otherwise normal. Recent Labs: From his hospitalization. Total cholesterol 201, LDL 111, HDL 39, triglycerides 255.  ASSESSMENT / PLAN: Non-STEMI (non-ST elevated myocardial infarction) He is quite today now it is anginal pain. I think he actually was having angina for about 2 weeks. Physiologically his presentation was probably more like a STEMI, but the collaterals from left to right made it look less like a STEMI on ECG.  He feels much better now  following his revascularization. He is now motivated to quit smoking and take charge of getting his life under control. He is excited the waiting starting cardiac rehabilitation, and I think his wife is actually as excited for the educational aspect as well.  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  Now completely revascularized with CABG. The extent of disease in the vessel for bypass should bode well for the longevity of his grafts. Thankfully he has preserved EF. He is on aspirin and statin and a beta blocker as well as ACE inhibitor. All at reasonable doses, as his blood pressure and heart rate are excellent.  S/P CABG x 3 He still a bit sore. Is waiting to go in to be driving. I recommend that he wait until he sees Dr. Laneta SimmersBartle to discuss the timing of it.  Dyslipidemia He currently is on atorvastatin home. This is in the past followed by his PCP. He is now is more stringent recommended values for control. Target LDL less than 70. His primary physician is a following his lipids in the past, I think is well within reason for him to continue doing so. May return the first year all be seeing the patient relatively often, and after that would only be alleviated his. He is likely to get closer control by seeing his PCP.  Tobacco abuse I am glad lesion on his desire to quit. He now sees her doing fine without the patch. Both he and his wife seem to be motivated to make this a clean break from smoking. I gave him the number day and she quit line and even said that he call back here to discuss any questions or concerns he had for support.  Overweight (BMI 25.0-29.9) He is quite excited weight loss he had from leaving the hospital. While this may not be the way that most all of the diet, a 27 pound weight loss in a month his outstanding. He'll be to try to keep it off and continue to lose a little bit more weight in order to keep his blood pressure and lipids under control.   Orders Placed  This Encounter  Procedures  . EKG 12-Lead   No orders of the defined types were placed in this encounter.    Followup: 3-4 months.  Tionna Gigante W. Herbie BaltimoreHARDING, M.D., M.S. [facility] [address] [place], [place]  [id number]  [phone] Pager # [phone]

## 2014-01-21 ENCOUNTER — Encounter: Payer: Self-pay | Admitting: Cardiology

## 2014-01-22 ENCOUNTER — Encounter: Payer: Self-pay | Admitting: Cardiology

## 2014-01-22 DIAGNOSIS — E663 Overweight: Secondary | ICD-10-CM | POA: Insufficient documentation

## 2014-01-22 NOTE — Assessment & Plan Note (Signed)
I am glad lesion on his desire to quit. He now sees her doing fine without the patch. Both he and his wife seem to be motivated to make this a clean break from smoking. I gave him the number day and she quit line and even said that he call back here to discuss any questions or concerns he had for support.

## 2014-01-22 NOTE — Assessment & Plan Note (Signed)
He is quite excited weight loss he had from leaving the hospital. While this may not be the way that most all of the diet, a 27 pound weight loss in a month his outstanding. He'll be to try to keep it off and continue to lose a little bit more weight in order to keep his blood pressure and lipids under control.

## 2014-01-22 NOTE — Assessment & Plan Note (Addendum)
He currently is on atorvastatin home. This is in the past followed by his PCP. He is now is more stringent recommended values for control. Target LDL less than 70. His primary physician is a following his lipids in the past, I think is well within reason for him to continue doing so. May return the first year all be seeing the patient relatively often, and after that would only be alleviated his. He is likely to get closer control by seeing his PCP.

## 2014-01-22 NOTE — Assessment & Plan Note (Signed)
He is quite today now it is anginal pain. I think he actually was having angina for about 2 weeks. Physiologically his presentation was probably more like a STEMI, but the collaterals from left to right made it look less like a STEMI on ECG.  He feels much better now following his revascularization. He is now motivated to quit smoking and take charge of getting his life under control. He is excited the waiting starting cardiac rehabilitation, and I think his wife is actually as excited for the educational aspect as well.

## 2014-01-22 NOTE — Assessment & Plan Note (Signed)
He still a bit sore. Is waiting to go in to be driving. I recommend that he wait until he sees Dr. Laneta SimmersBartle to discuss the timing of it.

## 2014-01-22 NOTE — Assessment & Plan Note (Addendum)
Now completely revascularized with CABG. The extent of disease in the vessel for bypass should bode well for the longevity of his grafts. Thankfully he has preserved EF. He is on aspirin and statin and a beta blocker as well as ACE inhibitor. All at reasonable doses, as his blood pressure and heart rate are excellent.

## 2014-01-24 ENCOUNTER — Telehealth: Payer: Self-pay | Admitting: *Deleted

## 2014-01-24 NOTE — Telephone Encounter (Signed)
Faxed cardiac rehab phase ll order -signed and dated

## 2014-01-25 ENCOUNTER — Other Ambulatory Visit: Payer: Self-pay | Admitting: *Deleted

## 2014-01-25 DIAGNOSIS — I2510 Atherosclerotic heart disease of native coronary artery without angina pectoris: Secondary | ICD-10-CM

## 2014-01-26 ENCOUNTER — Ambulatory Visit
Admission: RE | Admit: 2014-01-26 | Discharge: 2014-01-26 | Disposition: A | Discharge status: Home / Self Care | Payer: PRIVATE HEALTH INSURANCE | Source: Ambulatory Visit | Attending: Surgery | Admitting: Surgery

## 2014-01-26 ENCOUNTER — Ambulatory Visit: Payer: 59 | Admitting: Surgery

## 2014-01-26 DIAGNOSIS — I2510 Atherosclerotic heart disease of native coronary artery without angina pectoris: Secondary | ICD-10-CM

## 2014-01-27 ENCOUNTER — Ambulatory Visit (INDEPENDENT_AMBULATORY_CARE_PROVIDER_SITE_OTHER): Payer: 59 | Admitting: Surgery

## 2014-01-27 ENCOUNTER — Encounter: Payer: Self-pay | Admitting: Surgery

## 2014-01-27 VITALS — BP 110/72 | HR 72 | Resp 20 | Ht 70.0 in | Wt 204.0 lb

## 2014-01-27 DIAGNOSIS — Z951 Presence of aortocoronary bypass graft: Secondary | ICD-10-CM

## 2014-01-27 DIAGNOSIS — I2510 Atherosclerotic heart disease of native coronary artery without angina pectoris: Secondary | ICD-10-CM

## 2014-01-27 MED ORDER — OXYCODONE HCL 5 MG PO TABS: 5.0000 mg | ORAL_TABLET | ORAL | Status: DC | PRN

## 2014-01-27 NOTE — Progress Notes (Signed)
      HPI: Patient returns for routine postoperative follow-up having undergone CABG x 3 on 12/29/2013. The patient's early postoperative recovery while in the hospital was notable for an uncomplicated postop course. Since hospital discharge the patient reports that he has been feeling well. He is walking daily without chest pain or shortness of breath.   Current Outpatient Prescriptions  Medication Sig Dispense Refill  . aspirin EC 325 MG EC tablet Take 1 tablet (325 mg total) by mouth daily.      Marland Kitchen. atorvastatin (LIPITOR) 80 MG tablet Take 1 tablet (80 mg total) by mouth daily at 6 PM.  30 tablet  1  . ibuprofen (ADVIL,MOTRIN) 200 MG tablet Take 200 mg by mouth every 6 (six) hours as needed for mild pain.      Marland Kitchen. levothyroxine (SYNTHROID, LEVOTHROID) 50 MCG tablet Take 50 mcg by mouth daily before breakfast.      . lisinopril (PRINIVIL,ZESTRIL) 2.5 MG tablet Take 1 tablet (2.5 mg total) by mouth daily.  30 tablet  1  . metoprolol tartrate (LOPRESSOR) 25 MG tablet Take 1 tablet (25 mg total) by mouth 2 (two) times daily.  60 tablet  1  . oxyCODONE (OXY IR/ROXICODONE) 5 MG immediate release tablet Take 1-2 tablets (5-10 mg total) by mouth every 4 (four) hours as needed for moderate pain.  50 tablet  0  . traZODone (DESYREL) 50 MG tablet Take 150 mg by mouth at bedtime.       No current facility-administered medications for this visit.    Physical Exam: BP 110/72  Pulse 72  Resp 20  Ht 5\' 10"  (1.778 m)  Wt 204 lb (92.534 kg)  BMI 29.27 kg/m2  SpO2 96% He looks well. Lung exam is clear. Cardiac exam shows a regular rate and rhythm with normal heart sounds. Chest incision is healing well and sternum is stable. The leg incisions are healing well and there is no peripheral edema.    Diagnostic Tests:  CLINICAL DATA: Coronary artery disease. CABG.  EXAM:  CHEST 2 VIEW  COMPARISON: 01/02/2014  FINDINGS:  Heart size is normal. Postop CABG. Negative for heart failure. Mild    atelectasis in the lung bases. Small left effusion.  IMPRESSION:  Mild bibasilar atelectasis and minimal left effusion.  Electronically Signed  By: Marlan Palauharles Clark M.D.  On: 01/26/2014 09:43   Impression:  Overall I think he is doing well. I encouraged him to continue walking. He is planning to participate in cardiac rehab. I told him he could drive his car but should not lift anything heavier than 10 lbs for three months postop.   Plan:  He will continue to follow- up with Dr. Manus GunningEhinger and Dr. Herbie BaltimoreHarding and will contact me if he has any problems with his incisions.

## 2014-02-10 ENCOUNTER — Encounter (HOSPITAL_COMMUNITY)
Admission: RE | Admit: 2014-02-10 | Discharge: 2014-02-10 | Disposition: A | Discharge status: Home / Self Care | Payer: PRIVATE HEALTH INSURANCE | Source: Ambulatory Visit | Attending: Cardiology | Admitting: Cardiology

## 2014-02-10 DIAGNOSIS — Z5189 Encounter for other specified aftercare: Secondary | ICD-10-CM | POA: Insufficient documentation

## 2014-02-10 DIAGNOSIS — I214 Non-ST elevation (NSTEMI) myocardial infarction: Secondary | ICD-10-CM | POA: Insufficient documentation

## 2014-02-10 DIAGNOSIS — Q248 Other specified congenital malformations of heart: Secondary | ICD-10-CM | POA: Insufficient documentation

## 2014-02-10 DIAGNOSIS — D62 Acute posthemorrhagic anemia: Secondary | ICD-10-CM | POA: Insufficient documentation

## 2014-02-10 DIAGNOSIS — I2510 Atherosclerotic heart disease of native coronary artery without angina pectoris: Secondary | ICD-10-CM | POA: Insufficient documentation

## 2014-02-10 DIAGNOSIS — I4949 Other premature depolarization: Secondary | ICD-10-CM | POA: Insufficient documentation

## 2014-02-10 NOTE — Progress Notes (Signed)
Cardiac Rehab Medication Review by a Pharmacist  Does the patient  feel that his/her medications are working for him/her?  yes  Has the patient been experiencing any side effects to the medications prescribed?  no  Does the patient measure his/her own blood pressure or blood glucose at home?  yes - blood pressure  Does the patient have any problems obtaining medications due to transportation or finances?   no  Understanding of regimen: excellent Understanding of indications: good Potential of compliance: excellent    Pharmacist comments: Mr. Jason Roberts describes excellent adherence to his regimen and understands how to take them all. He notes no issues with his medications at this time.     Ninetta LightsFelt, Jason Roberts 02/10/2014 8:38 AM

## 2014-02-14 ENCOUNTER — Encounter (HOSPITAL_COMMUNITY)
Admission: RE | Admit: 2014-02-14 | Discharge: 2014-02-14 | Disposition: A | Discharge status: Home / Self Care | Payer: PRIVATE HEALTH INSURANCE | Source: Ambulatory Visit | Attending: Cardiology | Admitting: Cardiology

## 2014-02-14 NOTE — Progress Notes (Signed)
Pt in today for his first day of exercise at the 11:15 Phase II cardiac rehab.  Monitor shows SR with no noted ectopy.  Pt tolerated exercise with no complaints.  Pt's short term goal is decrease shortness of breath and long term goal is to get back to normal.  Will continue to reassess as pt's participation in the program. Karlene Linemanarlette Carlton RN, BSN

## 2014-02-16 ENCOUNTER — Encounter (HOSPITAL_COMMUNITY)
Admission: RE | Admit: 2014-02-16 | Discharge: 2014-02-16 | Disposition: A | Discharge status: Home / Self Care | Payer: PRIVATE HEALTH INSURANCE | Source: Ambulatory Visit | Attending: Cardiology | Admitting: Cardiology

## 2014-02-16 DIAGNOSIS — Z0279 Encounter for issue of other medical certificate: Secondary | ICD-10-CM

## 2014-02-18 ENCOUNTER — Encounter (HOSPITAL_COMMUNITY)
Admission: RE | Admit: 2014-02-18 | Discharge: 2014-02-18 | Disposition: A | Discharge status: Home / Self Care | Payer: PRIVATE HEALTH INSURANCE | Source: Ambulatory Visit | Attending: Cardiology | Admitting: Cardiology

## 2014-02-19 IMAGING — CR DG CHEST 1V PORT
2 series · 2 of 2 positions shown · non-contrast
Comparison: 02/22/2013

CLINICAL DATA: Chest pain

EXAM:
PORTABLE CHEST - 1 VIEW

[AP (1 of 2)]
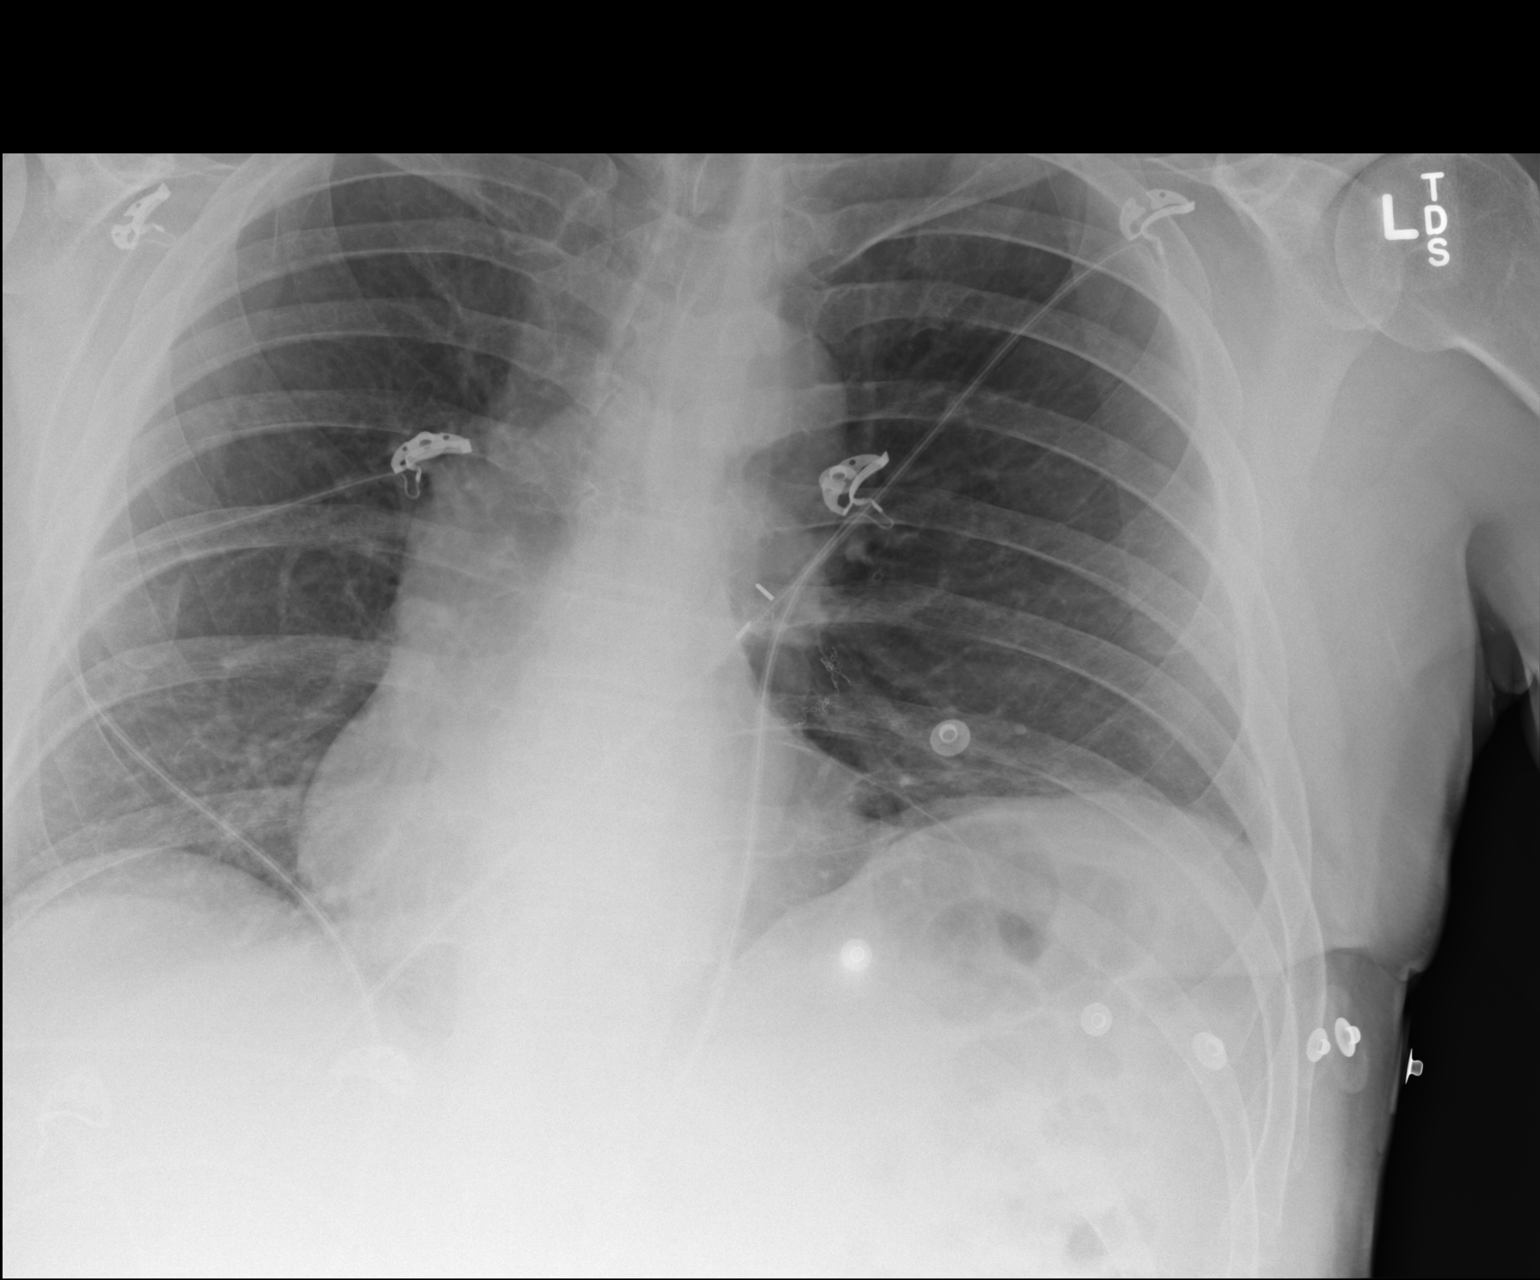

[AP (2 of 2)]
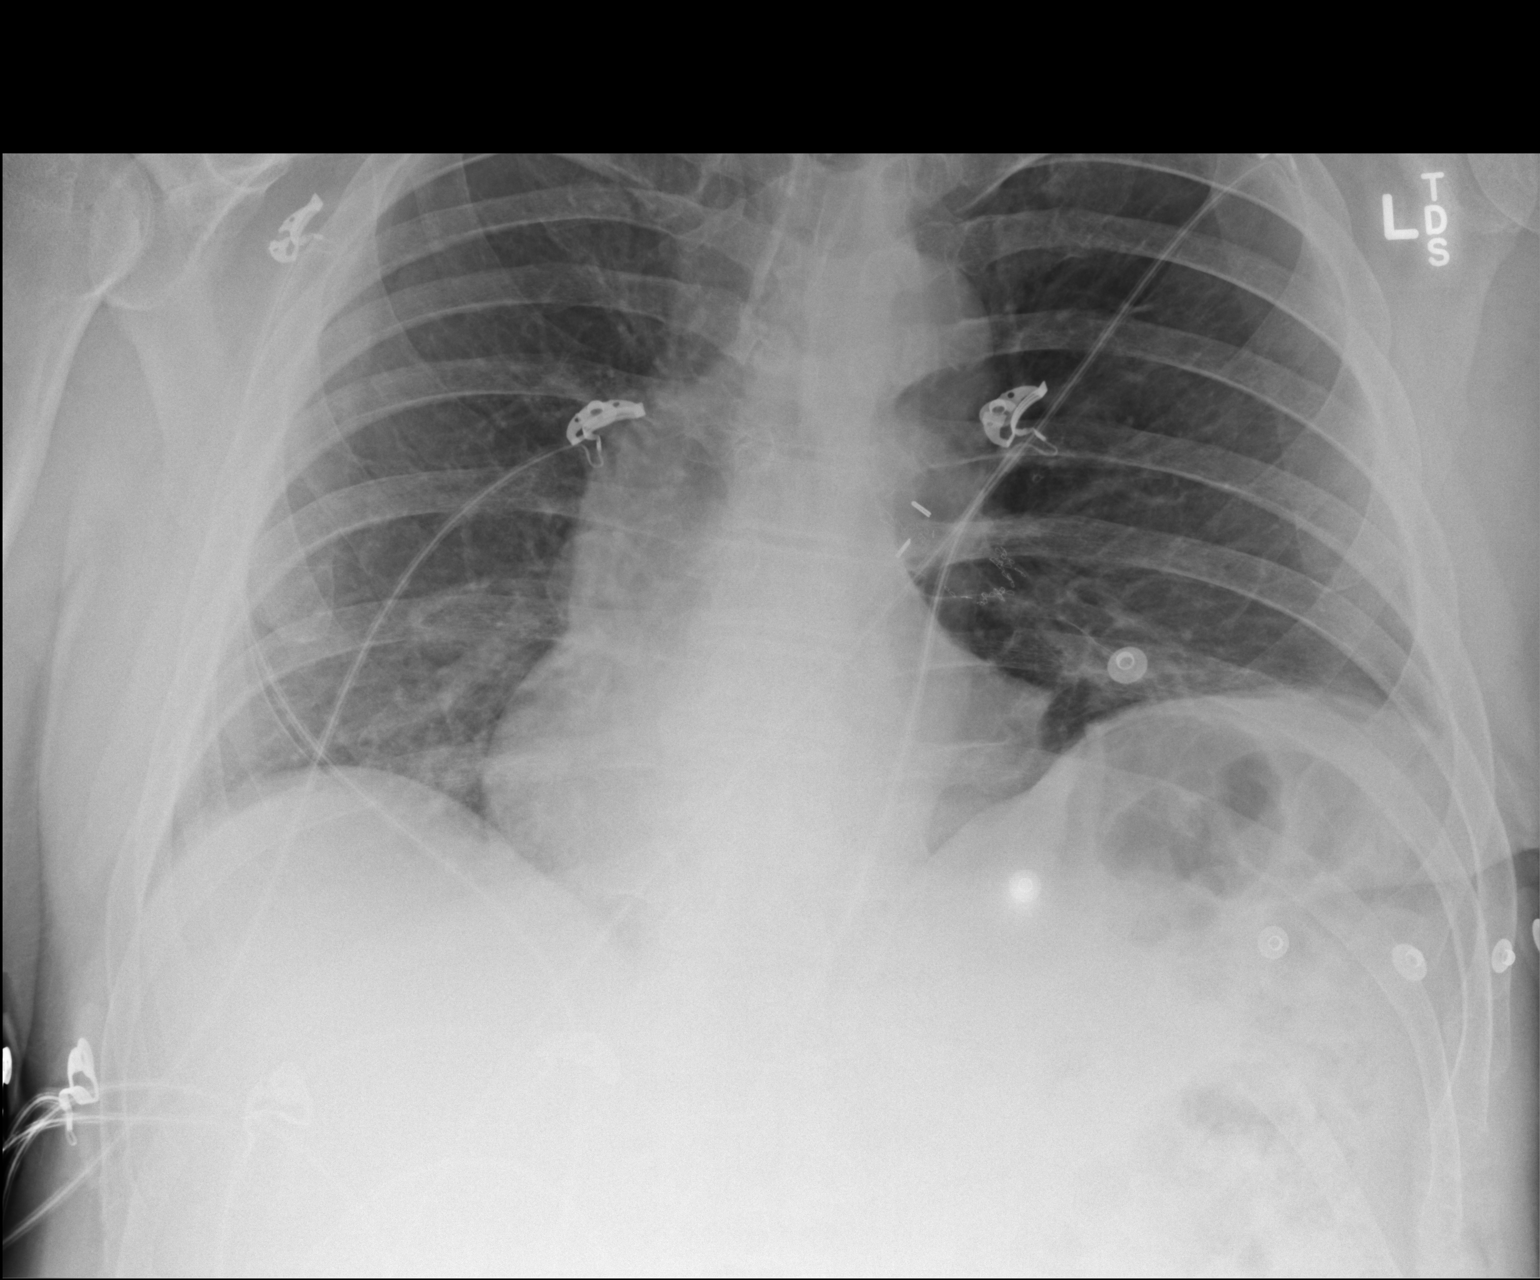

[2 of 2 positions shown; findings below may reference images not displayed]

FINDINGS: Postop changes left lung base with surgical clips in the left lower
hilum and scarring in the left lung base, unchanged. Negative for
mass lesion. Negative for pneumonia or heart failure.
IMPRESSION: Postop changes left lung base are stable. No superimposed acute
abnormality.

## 2014-02-21 ENCOUNTER — Encounter (HOSPITAL_COMMUNITY)
Admission: RE | Admit: 2014-02-21 | Discharge: 2014-02-21 | Disposition: A | Discharge status: Home / Self Care | Payer: PRIVATE HEALTH INSURANCE | Source: Ambulatory Visit | Attending: Cardiology | Admitting: Cardiology

## 2014-02-21 NOTE — Progress Notes (Signed)
Reviewed home exercise with pt today.  Pt plans to walk at home for exercise.  Reviewed THR, pulse, RPE, sign and symptoms, and when to call 911 or MD.  Pt voiced understanding. Jomo Forand, MA, ACSM RCEP   

## 2014-02-23 ENCOUNTER — Encounter (HOSPITAL_COMMUNITY)
Admission: RE | Admit: 2014-02-23 | Discharge: 2014-02-23 | Disposition: A | Discharge status: Home / Self Care | Payer: PRIVATE HEALTH INSURANCE | Source: Ambulatory Visit | Attending: Cardiology | Admitting: Cardiology

## 2014-02-24 IMAGING — DX DG CHEST 1V PORT
1 series · 1 of 1 positions shown · non-contrast
Comparison: 12/30/2013.

CLINICAL DATA: Post cardiac surgery.

EXAM:
PORTABLE CHEST - 1 VIEW

[portable]
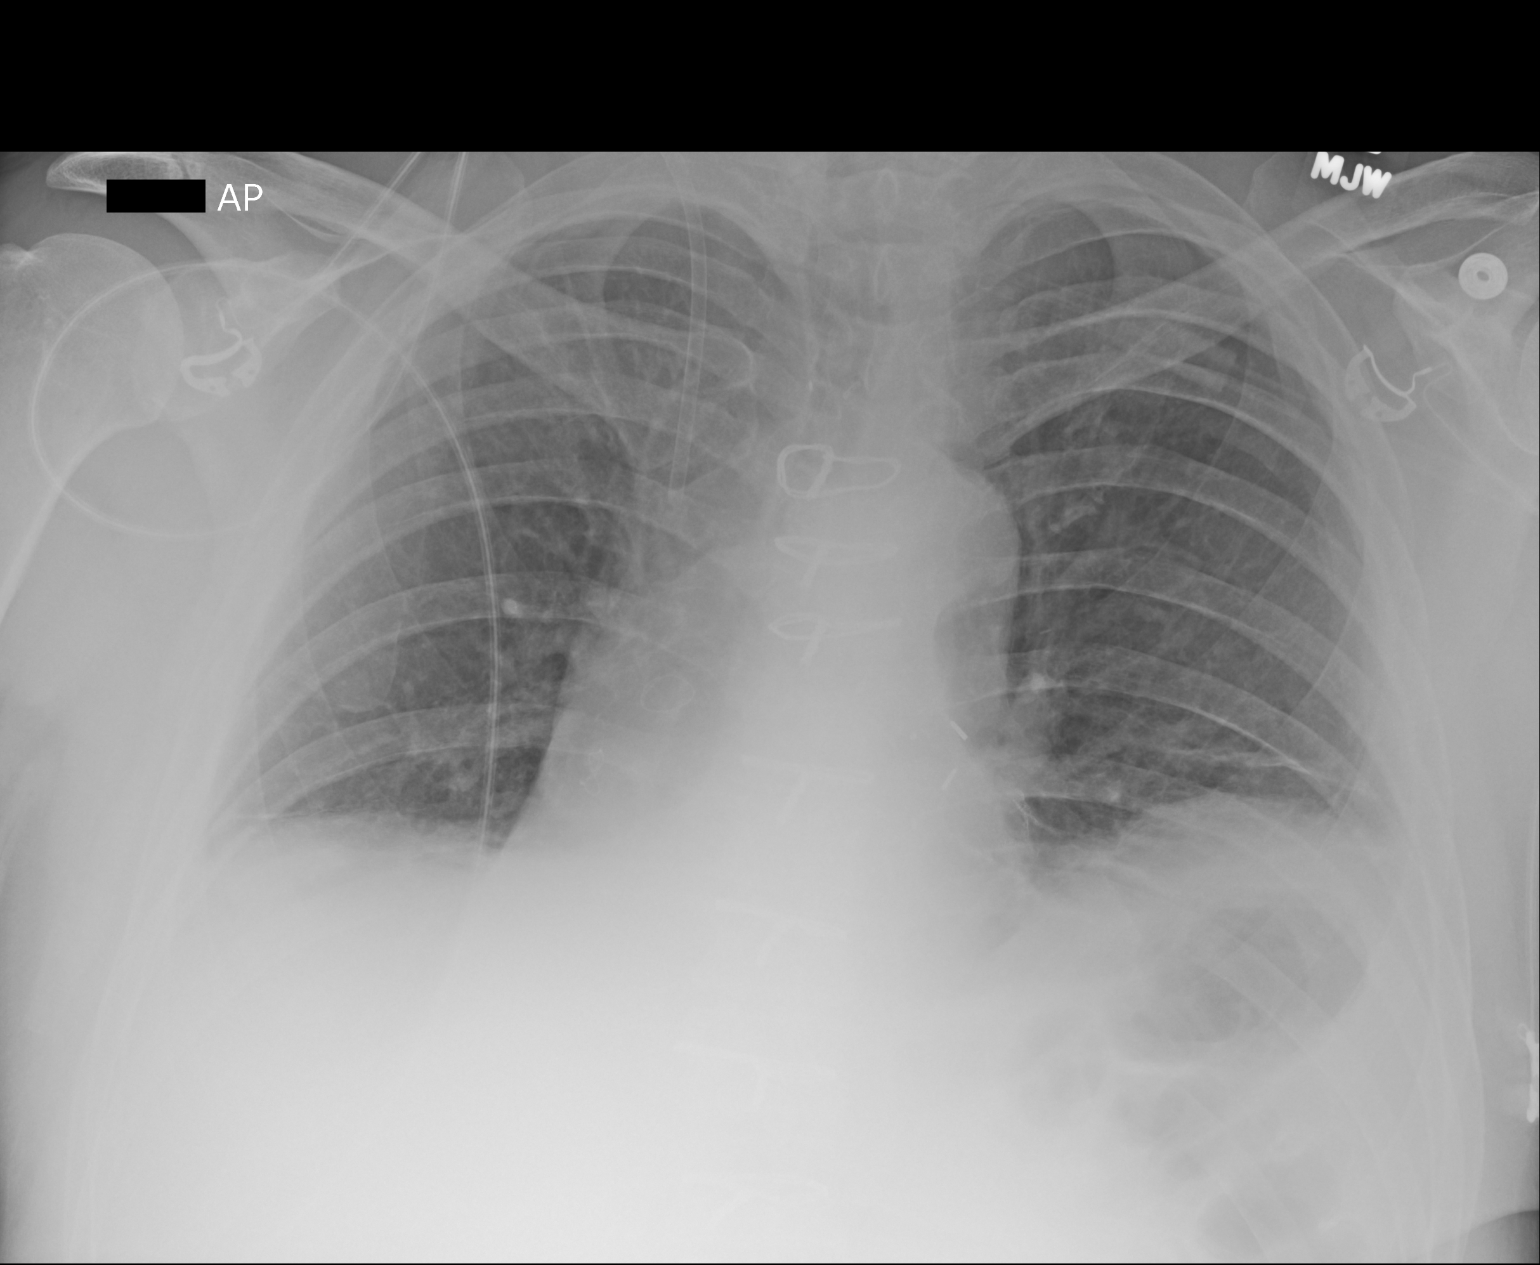

[1 of 1 positions shown; findings below may reference images not displayed]

FINDINGS: Swan-Ganz catheter has been removed. Introducer remains in place
with the tip at the level of the proximal superior vena cava

Left-sided chest tube and mediastinal drains have been removed. No
gross pneumothorax. Questionable tiny left apical pneumothorax may
represent reflection of rib rather than pneumothorax. Attention to
this on follow up.

Bibasilar subsegmental atelectasis and possible small pleural
effusions.

Post CABG. Heart silhouette difficult adequately assessed secondary
poor inspiration and elevated hemidiaphragms.

Mildly tortuous aorta.
IMPRESSION: Left-sided chest tube and mediastinal drains have been removed. No
gross pneumothorax. Questionable tiny left apical pneumothorax may
represent reflection of rib rather than pneumothorax. Attention to
this on follow up.

Bibasilar subsegmental atelectasis and possible small pleural
effusions.

## 2014-02-25 ENCOUNTER — Encounter (HOSPITAL_COMMUNITY)
Admission: RE | Admit: 2014-02-25 | Discharge: 2014-02-25 | Disposition: A | Discharge status: Home / Self Care | Payer: PRIVATE HEALTH INSURANCE | Source: Ambulatory Visit | Attending: Cardiology | Admitting: Cardiology

## 2014-02-26 IMAGING — CR DG CHEST 2V
2 series · 2 of 2 positions shown · non-contrast
Comparison: Chest x-ray 12/31/2013.

CLINICAL DATA: Status post CABG.

EXAM:
CHEST  2 VIEW

[w chest pa]
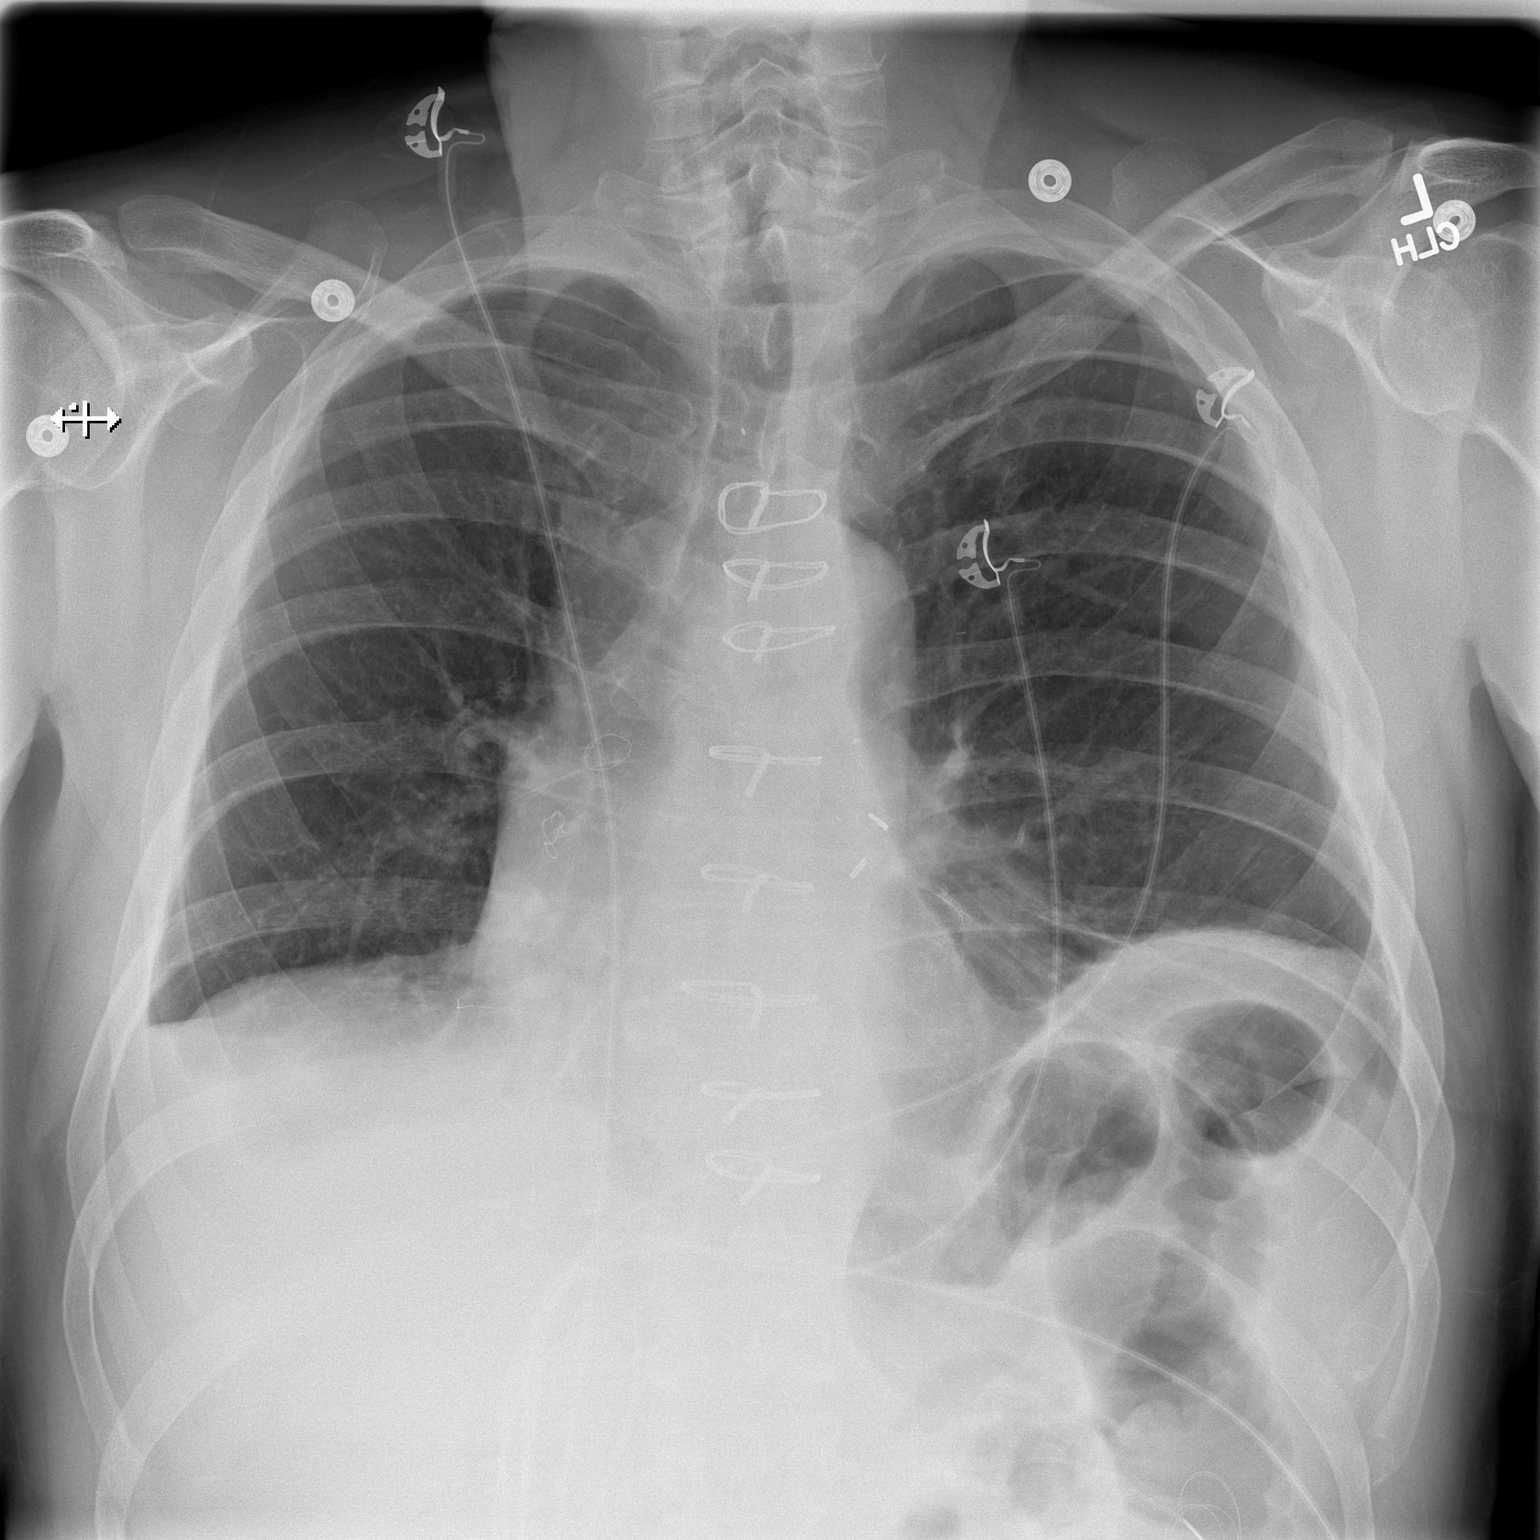

[w chest lat]
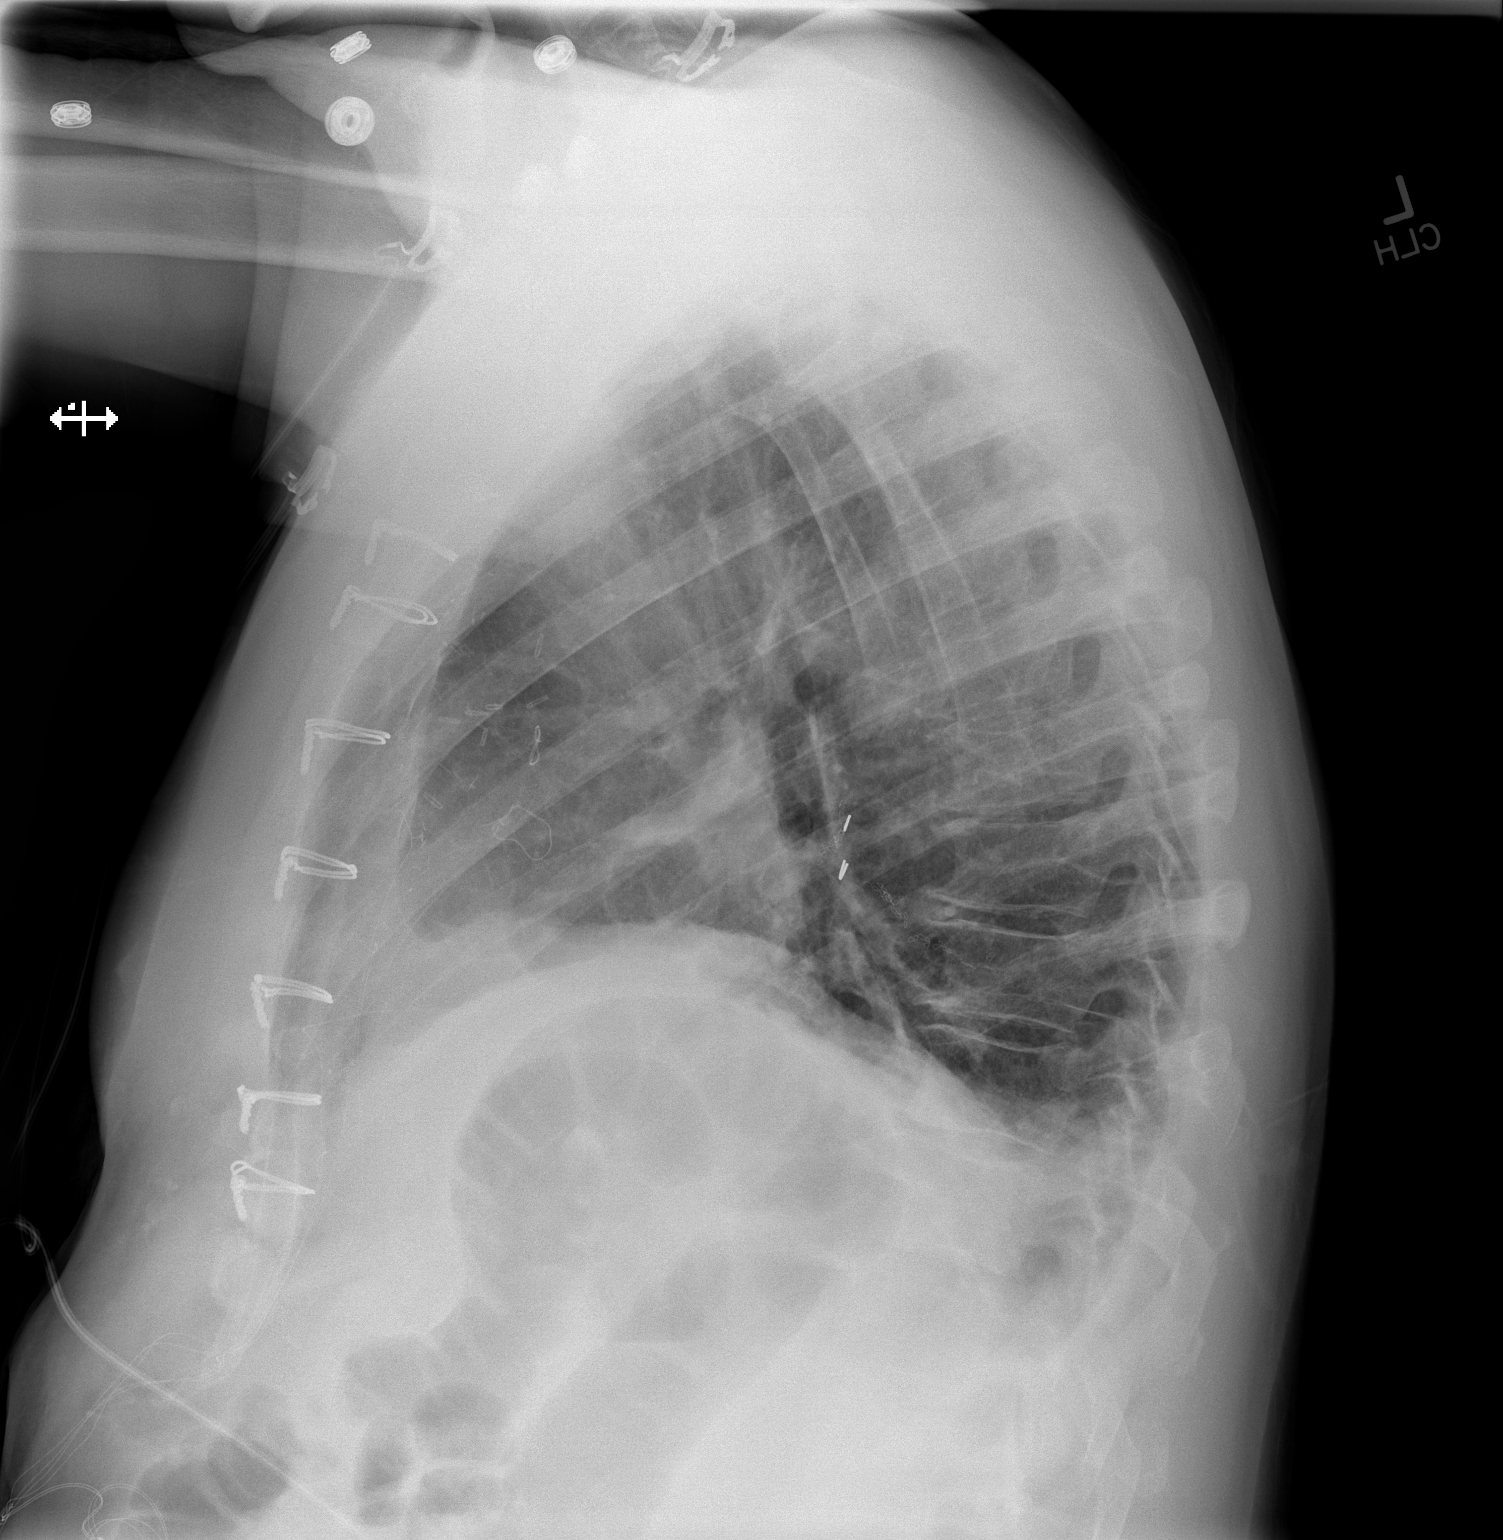

[2 of 2 positions shown; findings below may reference images not displayed]

FINDINGS: Interval removal of right IJ central venous catheter. Lung volumes
are low. Bibasilar opacities are favored to reflect subsegmental
atelectasis. Trace bilateral pleural effusions. No evidence of
pulmonary edema. Heart size is normal. The patient is rotated to the
right on today's exam, resulting in distortion of the mediastinal
contours and reduced diagnostic sensitivity and specificity for
mediastinal pathology. Status post median sternotomy for CABG.
Previously suspected tiny left apical pneumothorax is not
confidently identified on today's study.
IMPRESSION: 1. Postoperative changes and support apparatus, as above.
2. Low lung volumes with minimal bibasilar subsegmental atelectasis
trace bilateral pleural effusions.
3. No appreciable pneumothorax.

## 2014-02-28 ENCOUNTER — Telehealth: Payer: Self-pay | Admitting: *Deleted

## 2014-02-28 ENCOUNTER — Telehealth: Payer: Self-pay | Admitting: Cardiology

## 2014-02-28 ENCOUNTER — Encounter (HOSPITAL_COMMUNITY)
Admission: RE | Admit: 2014-02-28 | Discharge: 2014-02-28 | Disposition: A | Discharge status: Home / Self Care | Payer: PRIVATE HEALTH INSURANCE | Source: Ambulatory Visit | Attending: Cardiology | Admitting: Cardiology

## 2014-02-28 DIAGNOSIS — Z5189 Encounter for other specified aftercare: Secondary | ICD-10-CM | POA: Insufficient documentation

## 2014-02-28 DIAGNOSIS — I214 Non-ST elevation (NSTEMI) myocardial infarction: Secondary | ICD-10-CM | POA: Insufficient documentation

## 2014-02-28 DIAGNOSIS — D62 Acute posthemorrhagic anemia: Secondary | ICD-10-CM | POA: Insufficient documentation

## 2014-02-28 DIAGNOSIS — I2510 Atherosclerotic heart disease of native coronary artery without angina pectoris: Secondary | ICD-10-CM | POA: Insufficient documentation

## 2014-02-28 DIAGNOSIS — Q248 Other specified congenital malformations of heart: Secondary | ICD-10-CM | POA: Insufficient documentation

## 2014-02-28 DIAGNOSIS — I4949 Other premature depolarization: Secondary | ICD-10-CM | POA: Insufficient documentation

## 2014-02-28 NOTE — Telephone Encounter (Signed)
Patient returned call. RN inquired about his returned to work ,  What was the date Dr Laneta SimmersBartle gave him at the last office visit? RN needed information to assist in Disability form . Patient states he will contact Dr Laneta SimmersBartle and his job- about the return to work- -states he will Patent attorneycontact RN

## 2014-02-28 NOTE — Telephone Encounter (Signed)
Patient called back. Returning to work 03/28/14 still doing rehab 3 x week.

## 2014-02-28 NOTE — Telephone Encounter (Signed)
Spoke to patient- lost connection,attempted to call back -no answer  Left message to call back Had question concerning disability.

## 2014-03-02 ENCOUNTER — Telehealth (HOSPITAL_COMMUNITY): Payer: Self-pay | Admitting: Family Medicine

## 2014-03-02 ENCOUNTER — Encounter (HOSPITAL_COMMUNITY): Payer: PRIVATE HEALTH INSURANCE

## 2014-03-04 ENCOUNTER — Encounter (HOSPITAL_COMMUNITY)
Admission: RE | Admit: 2014-03-04 | Discharge: 2014-03-04 | Disposition: A | Discharge status: Home / Self Care | Payer: PRIVATE HEALTH INSURANCE | Source: Ambulatory Visit | Attending: Cardiology | Admitting: Cardiology

## 2014-03-07 ENCOUNTER — Encounter (HOSPITAL_COMMUNITY)
Admission: RE | Admit: 2014-03-07 | Discharge: 2014-03-07 | Disposition: A | Discharge status: Home / Self Care | Payer: PRIVATE HEALTH INSURANCE | Source: Ambulatory Visit | Attending: Cardiology | Admitting: Cardiology

## 2014-03-09 ENCOUNTER — Encounter (HOSPITAL_COMMUNITY): Payer: PRIVATE HEALTH INSURANCE

## 2014-03-09 ENCOUNTER — Telehealth (HOSPITAL_COMMUNITY): Payer: Self-pay | Admitting: Family Medicine

## 2014-03-11 ENCOUNTER — Telehealth: Payer: Self-pay | Admitting: *Deleted

## 2014-03-11 ENCOUNTER — Encounter (HOSPITAL_COMMUNITY)
Admission: RE | Admit: 2014-03-11 | Discharge: 2014-03-11 | Disposition: A | Discharge status: Home / Self Care | Payer: PRIVATE HEALTH INSURANCE | Source: Ambulatory Visit | Attending: Cardiology | Admitting: Cardiology

## 2014-03-11 NOTE — Telephone Encounter (Signed)
Faxed [employer] -[employer] (S/P CABG surgery) Patient out for 3 months per Bartle/Harding

## 2014-03-14 ENCOUNTER — Encounter (HOSPITAL_COMMUNITY)
Admission: RE | Admit: 2014-03-14 | Discharge: 2014-03-14 | Disposition: A | Discharge status: Home / Self Care | Payer: PRIVATE HEALTH INSURANCE | Source: Ambulatory Visit | Attending: Cardiology | Admitting: Cardiology

## 2014-03-16 ENCOUNTER — Encounter (HOSPITAL_COMMUNITY)
Admission: RE | Admit: 2014-03-16 | Discharge: 2014-03-16 | Disposition: A | Discharge status: Home / Self Care | Payer: PRIVATE HEALTH INSURANCE | Source: Ambulatory Visit | Attending: Cardiology | Admitting: Cardiology

## 2014-03-18 ENCOUNTER — Encounter (HOSPITAL_COMMUNITY): Payer: PRIVATE HEALTH INSURANCE

## 2014-03-18 ENCOUNTER — Telehealth (HOSPITAL_COMMUNITY): Payer: Self-pay | Admitting: Family Medicine

## 2014-03-21 ENCOUNTER — Encounter (HOSPITAL_COMMUNITY): Payer: PRIVATE HEALTH INSURANCE

## 2014-04-22 ENCOUNTER — Encounter: Payer: Self-pay | Admitting: Cardiology

## 2014-04-22 ENCOUNTER — Ambulatory Visit (INDEPENDENT_AMBULATORY_CARE_PROVIDER_SITE_OTHER): Payer: 59 | Admitting: Cardiology

## 2014-04-22 VITALS — BP 140/92 | HR 65 | Ht 70.0 in | Wt 212.1 lb

## 2014-04-22 DIAGNOSIS — I2510 Atherosclerotic heart disease of native coronary artery without angina pectoris: Secondary | ICD-10-CM

## 2014-04-22 DIAGNOSIS — I10 Essential (primary) hypertension: Secondary | ICD-10-CM

## 2014-04-22 DIAGNOSIS — Z79899 Other long term (current) drug therapy: Secondary | ICD-10-CM

## 2014-04-22 DIAGNOSIS — Z720 Tobacco use: Secondary | ICD-10-CM

## 2014-04-22 DIAGNOSIS — F17200 Nicotine dependence, unspecified, uncomplicated: Secondary | ICD-10-CM

## 2014-04-22 DIAGNOSIS — E782 Mixed hyperlipidemia: Secondary | ICD-10-CM

## 2014-04-22 DIAGNOSIS — G4700 Insomnia, unspecified: Secondary | ICD-10-CM

## 2014-04-22 DIAGNOSIS — Q240 Dextrocardia: Secondary | ICD-10-CM

## 2014-04-22 DIAGNOSIS — Z951 Presence of aortocoronary bypass graft: Secondary | ICD-10-CM

## 2014-04-22 DIAGNOSIS — E663 Overweight: Secondary | ICD-10-CM

## 2014-04-22 DIAGNOSIS — E785 Hyperlipidemia, unspecified: Secondary | ICD-10-CM

## 2014-04-22 DIAGNOSIS — Q248 Other specified congenital malformations of heart: Secondary | ICD-10-CM

## 2014-04-22 MED ORDER — LISINOPRIL 5 MG PO TABS: 5.0000 mg | ORAL_TABLET | Freq: Every day | ORAL | Status: DC

## 2014-04-22 NOTE — Progress Notes (Signed)
PCP: Simona Huh, MD  Clinic Note: Chief Complaint  Patient presents with  . 25monthvisit     chest pain-congestion, no sob , no edema, stopped rehab when he return to work    HPI: Jason HUTSELLis a 58y.o. male with a Cardiovascular Problem List below who presents today for three-month followup for CAD status post CABG. I last saw him in January his initial visit following a hospitalization for non-STEMI word he was found to have multivessel disease and referred for urgent CABG. He had an occluded RCA left main disease with a significant lesion in the LAD as well as OM 3. There was a question at the time of dextrocardia, however it would really only has a vertical heart and not true dextrocardia.  Interval History: Today his major complaint is upper respiratory tract symptoms infection symptoms of significant cough but this caused a lot of musculoskeletal aches and pains in his chest. Also gets short of breath with congestion, he does currently note that this symptom he feel he is not at all similar to his anginal equivalent.  He initially been doing cardiac rehabilitation but had the stop when he returned to work due to the amount of hours he was losing from work. He is relatively active now however and denies any anginal chest pain or dyspnea at rest or with exertion. No PND, orthopnea or edema. No rapid or irregular heartbeats, syncope/near syncope or TIAs with amaurosis fugax symptoms. No melena, hematochezia, epistaxis or hematuria.  No claudication. He says he sleeps with 2 pillows as a chronic comfort mechanism and not from orthopnea.  Past Medical History  Diagnosis Date  . Hemorrhoids   . 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  .Marland KitchenHistory of empyema of pleura      status post partial lung resection  . Non-STEMI (non-ST elevated myocardial infarction) 12/26/2013     Following 2 weeks of stuttering chest pain  . CAD in native artery 12/26/2013   Three-vessel disease with 100% occluded RCA (phyysioologically a STEMI), 90% OM 3, at present LAD. Extensive left right collaterals.  . 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 (Dr. BCyndia Bent  . History of tobacco abuse     Quit during this hospitalization for MI.    Prior Cardiac Evaluation and Past Surgical History: Past Surgical History  Procedure Laterality Date  . Lll lung  1977    due to empyema  . Laminectomy    . Cardiac catheterization  12/28/2013    Early mid LAD 80% at takeoff of D1. Prox OM3 90%, 100 occluded RCA  . Coronary artery bypass graft N/A 12/29/2013    Procedure: CORONARY ARTERY BYPASS GRAFTING (CABG);  Surgeon: BGaye Pollack MD;  [place] [facility]  Service: Open Heart Surgery;  Laterality: N/A;  Times three  using left internal mammary artery and endoscopically harvested right saphenous vein  . Intraoperative transesophageal echocardiogram N/A 12/29/2013    Procedure: INTRAOPERATIVE TRANSESOPHAGEAL ECHOCARDIOGRAM;  Surgeon: BGaye Pollack MD;  [facility]  Service: Open Heart Surgery;  Laterality: N/A;  . Left arm chain saw injury    . Transthoracic echocardiogram  12/27/2013    DDS. Poor sternal 1 doesn't do to right deviation. Normal LV size and function. Otherwise essentially normal with the exception of the angle the heart.    MEDICATIONS AND ALLERGIES REVIEWED IN EPIC No Change in Social and Family History  ROS: A comprehensive Review of Systems - Negative except URI symptoms of nasal congestion cough chest congestion. He says that pain he has is mostly starting in the back radiates around the front  PHYSICAL EXAM BP 140/92  Pulse 65  Ht 5' 10"  (1.778 m)  Wt 212 lb 1.6 oz (96.208 kg)  BMI 30.43 kg/m2 General appearance: alert, cooperative, appears stated age, no distress. Well-nourished and well-groomed. Great mood and affect.  Neck: no adenopathy, no carotid bruit, no JVD, supple, symmetrical, trachea midline and thyroid not  enlarged, symmetric, no tenderness/mass/nodules  Lungs: clear to auscultation bilaterally, normal percussion bilaterally and Nonlabored movement. Mild interstitial lung sounds are noted intermittently. Heart: regular rate and rhythm, S1, S2 normal, no murmur, click, rub or gallop, normal apical impulse and Well-healing sternotomy scar. His heart sounds are more located to the right than usual, however the apical impulse beat is not midclavicular on the right side is more almost substernal.  Abdomen: soft, non-tender; bowel sounds normal; no masses, no organomegaly  Extremities: extremities normal, atraumatic, no cyanosis or edema; Pulses: 2+ and symmetric  Neurologic: Alert and oriented X 3, normal strength and tone. Normal symmetric reflexes. Normal coordinati2on and gait   Adult ECG Report  Rate: 65 ;  Rhythm: normal sinus rhythm  QRS Axis: -16 ;  PR Interval: 170 ;  QRS Duration: 106 ; QTc: 407  Voltages: Normal  Conduction Disturbances: none  Other Abnormalities: Mild nonspecific ST and T wave changes with slight ST segment depressions in 3 and aVF   Narrative Interpretation: Overall stable EKG  Recent Labs none checked since December:   ASSESSMENT / PLAN: CAD - severe multivessel disease of native vessels.  Revascularized via CABG no recurrent anginal symptoms. Preserved EF by echocardiogram. On aspirin, statin, ACE inhibitor and beta blocker.  Plan: Increase her lisinopril to 5 mg each bedtime; moderate cough to see if this could potentially be an ACE inhibitor cough. In which case we would switch him over to an ARB.  Dextrocardia Operatively he did not appear to have dextrocardia.  Dyslipidemia On high-dose atorvastatin. LDL in December was 111. He is due for recheck. We shall see how well his LDL is improved, and consider possibly decreasing atorvastatin 40 mg.  Tobacco abuse He initially did so well post operatively with smoking sensation, however is now back to smoking a  couple cigarettes a day ever since, to work. He says is mostly a social pressure phenomenon. He is trying hard to abstain and finally quit. He however finds it very difficult based on the social situation at work.  Essential hypertension Slightly high pressure today. We sincerely does with increased dose of lisinopril. He tells me at home his blood pressure better than it is today.  Overweight (BMI 25.0-29.9) I think he put on some of the weight that he lost back in the hospital. He is now just above the obese line. Again the importance of dietary modification and picking up his exercise level.    Orders Placed This Encounter  Procedures  . Comp Met (CMET)  . Lipid Profile  . EKG 12-Lead   Meds ordered this encounter  Medications  . triamcinolone cream (KENALOG) 0.5 %    Sig:   . lisinopril (PRINIVIL,ZESTRIL) 5 MG tablet    Sig: Take 1 tablet (5 mg total) by mouth daily.    Dispense:  90 tablet    Refill:  3    Followup:  November-December timeframe  Bailei Buist W. Ellyn Hack, M.D., M.S. Interventional  Cardiologist [facility]

## 2014-04-22 NOTE — Patient Instructions (Signed)
Your physician recommends that you schedule a follow-up appointment in: 6 months  Your physician recommends that you return for lab work in: 2 weeks CMP, FASTING LIPIDS  Your physician has recommended you make the following change in your medication: Increase Lisinopril to 5 mg daily

## 2014-04-24 ENCOUNTER — Encounter: Payer: Self-pay | Admitting: Cardiology

## 2014-04-24 DIAGNOSIS — I10 Essential (primary) hypertension: Secondary | ICD-10-CM | POA: Insufficient documentation

## 2014-04-24 NOTE — Assessment & Plan Note (Signed)
Revascularized via CABG no recurrent anginal symptoms. Preserved EF by echocardiogram. On aspirin, statin, ACE inhibitor and beta blocker.  Plan: Increase her lisinopril to 5 mg each bedtime; moderate cough to see if this could potentially be an ACE inhibitor cough. In which case we would switch him over to an ARB.

## 2014-04-24 NOTE — Assessment & Plan Note (Signed)
He initially did so well post operatively with smoking sensation, however is now back to smoking a couple cigarettes a day ever since, to work. He says is mostly a social pressure phenomenon. He is trying hard to abstain and finally quit. He however finds it very difficult based on the social situation at work.

## 2014-04-24 NOTE — Assessment & Plan Note (Signed)
I think he put on some of the weight that he lost back in the hospital. He is now just above the obese line. Again the importance of dietary modification and picking up his exercise level.

## 2014-04-24 NOTE — Assessment & Plan Note (Signed)
Operatively he did not appear to have dextrocardia.

## 2014-04-24 NOTE — Assessment & Plan Note (Signed)
Slightly high pressure today. We sincerely does with increased dose of lisinopril. He tells me at home his blood pressure better than it is today.

## 2014-04-24 NOTE — Assessment & Plan Note (Signed)
On high-dose atorvastatin. LDL in December was 111. He is due for recheck. We shall see how well his LDL is improved, and consider possibly decreasing atorvastatin 40 mg.

## 2014-10-13 ENCOUNTER — Telehealth: Payer: Self-pay | Admitting: Cardiology

## 2014-10-14 NOTE — Telephone Encounter (Signed)
Close encounter 

## 2014-10-20 ENCOUNTER — Ambulatory Visit: Payer: 59 | Admitting: Cardiology

## 2014-11-28 ENCOUNTER — Ambulatory Visit (INDEPENDENT_AMBULATORY_CARE_PROVIDER_SITE_OTHER): Payer: Managed Care, Other (non HMO) | Admitting: Cardiology

## 2014-11-28 ENCOUNTER — Encounter: Payer: Self-pay | Admitting: Cardiology

## 2014-11-28 VITALS — BP 120/82 | HR 66 | Ht 70.0 in | Wt 210.7 lb

## 2014-11-28 DIAGNOSIS — E669 Obesity, unspecified: Secondary | ICD-10-CM | POA: Insufficient documentation

## 2014-11-28 DIAGNOSIS — Z951 Presence of aortocoronary bypass graft: Secondary | ICD-10-CM

## 2014-11-28 DIAGNOSIS — Z720 Tobacco use: Secondary | ICD-10-CM

## 2014-11-28 DIAGNOSIS — I2510 Atherosclerotic heart disease of native coronary artery without angina pectoris: Secondary | ICD-10-CM

## 2014-11-28 DIAGNOSIS — E785 Hyperlipidemia, unspecified: Secondary | ICD-10-CM

## 2014-11-28 DIAGNOSIS — I10 Essential (primary) hypertension: Secondary | ICD-10-CM

## 2014-11-28 NOTE — Patient Instructions (Signed)
LABS- CMP, LIPIDS - NOTHING TO EAT OR DRINK THE MORNING OF THE LAB DRAWN   Your physician wants you to follow-up in 6 MONTH Dr Herbie BaltimoreHarding.  You will receive a reminder letter in the mail two months in advance. If you don't receive a letter, please call our office to schedule the follow-up appointment.

## 2014-11-28 NOTE — Progress Notes (Addendum)
PCP: Thora Lance, MD  Clinic Note: Chief Complaint  Patient presents with  . Follow-up    6 mth visit.Marland Kitchen pt denies chest pain, swelling, and sob.  . Coronary Artery Disease    H/o NSTEMI - MV CAD- CABG    HPI: Jason Roberts is a 58 y.o. male with a PMH below who presents today for 6 month f/u of CAD-CABG & HLD.   Since I last saw him, his Synthroid dose was increased to 75 mcg daily.  Past Medical History  Diagnosis Date  . Hemorrhoids   . 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  Marland Kitchen History of empyema of pleura      status post partial lung resection  . Non-STEMI (non-ST elevated myocardial infarction) 12/26/2013     Following 2 weeks of stuttering chest pain  . CAD in native artery 12/26/2013    Three-vessel disease with 100% occluded RCA (phyysioologically a STEMI), 90% OM 3, at present LAD. Extensive left right collaterals.  . 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 (Dr. Laneta Simmers)  . History of tobacco abuse     Quit during this hospitalization for MI.   Prior Cardiac Evaluation and Procedural History: Procedure Laterality Date  . Cardiac catheterization  12/28/2013    Early mid LAD 80% at takeoff of D1. Prox OM3 90%, 100 occluded RCA  . Coronary artery bypass graft N/A 12/29/2013    Procedure: CORONARY ARTERY BYPASS GRAFTING (CABG);  Surgeon: Alleen Borne, MD;  Location: [facility];  Service: Open Heart Surgery;  Laterality: N/A;  Times three  using left internal mammary artery and endoscopically harvested right saphenous vein  . Intraoperative transesophageal echocardiogram N/A 12/29/2013    Procedure: INTRAOPERATIVE TRANSESOPHAGEAL ECHOCARDIOGRAM;  Surgeon: Alleen Borne, MD;  Location: [facility];  Service: Open Heart Surgery;  Laterality: N/A;  . Transthoracic echocardiogram  12/27/2013    DDS. Poor sternal 1 doesn't do to right deviation. Normal LV size and function. Otherwise essentially normal with the exception of the  angle the heart.   Interval History: Jason Roberts presents today doing relatively well from a cardiac standpoint. He is here for six-month followup. For the most part he denies any resting or exertional chest tightness or pressure. He does feel a bit short of breath the goes up and down a lot of stairs or signs of life skills. Sometimes going deer hunting he climbs up and down his deer stand that technique and a bit tired & short of breath. Otherwise the cardiac standpoint, he denies heart failure symptoms of PND, orthopnea or edema. No palpitations, lightheadedness, dizziness, weakness or syncope/near syncope.  No TIA/amaurosis fugax symptoms. No melena, hematochezia, hematuria, or epstaxis. No claudication.  ROS: A comprehensive was performed. Review of Systems  Constitutional: Negative for weight loss.  HENT: Negative for nosebleeds.   Respiratory: Negative for cough, shortness of breath and wheezing.   Cardiovascular:       Per HPI  Gastrointestinal: Negative for heartburn and melena.  Genitourinary: Negative for hematuria.  Musculoskeletal: Positive for joint pain. Negative for myalgias and falls.  Neurological: Negative for dizziness, seizures, loss of consciousness and headaches.  Endo/Heme/Allergies: Does not bruise/bleed easily.  Psychiatric/Behavioral: Negative for depression and memory loss. The patient is not nervous/anxious and does not have insomnia.   All other systems reviewed and are negative.   Current Outpatient Prescriptions on File Prior to Visit  Medication Sig Dispense Refill  .  aspirin EC 325 MG EC tablet Take 1 tablet (325 mg total) by mouth daily.    Marland Kitchen. atorvastatin (LIPITOR) 80 MG tablet Take 1 tablet (80 mg total) by mouth daily at 6 PM. 30 tablet 1  . ibuprofen (ADVIL,MOTRIN) 200 MG tablet Take 200 mg by mouth every 6 (six) hours as needed for mild pain.    Marland Kitchen. levothyroxine (SYNTHROID, LEVOTHROID) 50 MCG tablet Take 50 mcg by mouth daily before breakfast.    .  lisinopril (PRINIVIL,ZESTRIL) 5 MG tablet Take 1 tablet (5 mg total) by mouth daily. (Patient taking differently: Take 5 mg by mouth daily. Take 1 1/2 tablet daily) 90 tablet 3  . metoprolol tartrate (LOPRESSOR) 25 MG tablet Take 1 tablet (25 mg total) by mouth 2 (two) times daily. 60 tablet 1  . traZODone (DESYREL) 50 MG tablet Take 150 mg by mouth at bedtime.    . triamcinolone cream (KENALOG) 0.5 %      No current facility-administered medications on file prior to visit.   ALLERGIES REVIEWED IN EPIC -- No change SOCIAL AND FAMILY HISTORY REVIEWED IN EPIC -- NO change -- besides having started back occasional cigarette smoking.  Wt Readings from Last 3 Encounters:  11/28/14 210 lb 11.2 oz (95.573 kg)  04/22/14 212 lb 1.6 oz (96.208 kg)  02/10/14 207 lb 0.2 oz (93.9 kg)    PHYSICAL EXAM BP 120/82 mmHg  Pulse 66  Ht 5\' 10"  (1.778 m)  Wt 210 lb 11.2 oz (95.573 kg)  BMI 30.23 kg/m2 General appearance: alert, cooperative, appears stated age, no distress. Well-nourished and well-groomed. Great mood and affect.  Neck: no adenopathy, no carotid bruit, no JVD, supple, symmetrical, trachea midline and thyroid not enlarged, symmetric, no tenderness/mass/nodules  Lungs: clear to auscultation bilaterally, normal percussion bilaterally and Nonlabored movement. Mild interstitial lung sounds are noted intermittently. Heart: regular rate and rhythm, S1, S2 normal, no murmur, click, rub or gallop, normal apical impulse and Well-healing sternotomy scar. His heart sounds are more located to the right than usual, however the apical impulse beat is not midclavicular on the right side is more almost substernal.  Abdomen: soft, non-tender; bowel sounds normal; no masses, no organomegaly  Extremities: extremities normal, atraumatic, no cyanosis or edema; Pulses: 2+ and symmetric  Neurologic: Alert and oriented X 3, normal strength and tone. Normal symmetric reflexes. Normal coordinati2on and gait   Adult ECG  Report  Rate: 66 ;  Rhythm: normal sinus rhythm, borderline Left Axis Deviation. Mild, non-specific ST-T cahgnes in III & aVF  Narrative Interpretation: Otherwise normal  Recent Labs:  None  ASSESSMENT / PLAN: Multivessel CAD - s/p CABG He underwent complete revascularization via CABG. No recurrent anginal symptoms with normal preserved EF by echo. He is on a stable regimen of aspirin, statin ACE inhibitor and beta blocker with well-controlled blood pressure. Since his blood pressure is stable, we'll not further titrate his medications.  Essential hypertension Blood pressure was much better today with the higher dose of ACE inhibitor. We'll see the continued current dose of medications.  Dyslipidemia, goal LDL below 70 He continues to be on high-dose statin. He was supposed to have a recheck of his lipids prior to this visit, but that did not happen. We'll go ahead and recheck a fasting lipid panel along with CMP. If his lipids look better, we can consider reducing his Lipitor dose to 40 mg daily.  Obesity (BMI 30-39.9) He asked if at all the way to his last visit. He has been  a little more and not exercising as much. I talked about making sure he stays active with his exercise and walking and also avoid going back to bad eating habits  Tobacco abuse Unfortunately he is back smoking a few cigarettes a day. Again more of a social pressure issue. I discussed with them to avoid the triggers that make him want to smoke cigarettes. Smoking cessation instruction/counseling given:  counseled patient on the dangers of tobacco use, advised patient to stop smoking, and reviewed strategies to maximize success    Orders Placed This Encounter  Procedures  . Comprehensive metabolic panel    Order Specific Question:  Has the patient fasted?    Answer:  Yes  . Lipid panel    Order Specific Question:  Has the patient fasted?    Answer:  Yes  . EKG 12-Lead   Meds ordered this encounter    Medications  . levothyroxine (SYNTHROID, LEVOTHROID) 75 MCG tablet    Sig:     Followup: 6 months   HARDING,DAVID W, M.D., M.S. Interventional Cardiologist   Pager # [phone]    Labs from PCP:  Chem panel ~normal  TC 112, TG 125, HDL 37, LDL 51

## 2014-11-29 ENCOUNTER — Encounter: Payer: Self-pay | Admitting: Cardiology

## 2014-11-29 NOTE — Assessment & Plan Note (Signed)
He continues to be on high-dose statin. He was supposed to have a recheck of his lipids prior to this visit, but that did not happen. We'll go ahead and recheck a fasting lipid panel along with CMP. If his lipids look better, we can consider reducing his Lipitor dose to 40 mg daily.

## 2014-11-29 NOTE — Assessment & Plan Note (Signed)
Unfortunately he is back smoking a few cigarettes a day. Again more of a social pressure issue. I discussed with them to avoid the triggers that make him want to smoke cigarettes. Smoking cessation instruction/counseling given:  counseled patient on the dangers of tobacco use, advised patient to stop smoking, and reviewed strategies to maximize success

## 2014-11-29 NOTE — Assessment & Plan Note (Signed)
He underwent complete revascularization via CABG. No recurrent anginal symptoms with normal preserved EF by echo. He is on a stable regimen of aspirin, statin ACE inhibitor and beta blocker with well-controlled blood pressure. Since his blood pressure is stable, we'll not further titrate his medications.

## 2014-11-29 NOTE — Assessment & Plan Note (Signed)
Blood pressure was much better today with the higher dose of ACE inhibitor. We'll see the continued current dose of medications.

## 2014-11-29 NOTE — Assessment & Plan Note (Signed)
He asked if at all the way to his last visit. He has been a little more and not exercising as much. I talked about making sure he stays active with his exercise and walking and also avoid going back to bad eating habits

## 2014-12-06 ENCOUNTER — Encounter: Payer: Self-pay | Admitting: Cardiology

## 2014-12-08 ENCOUNTER — Encounter (HOSPITAL_COMMUNITY): Payer: Self-pay | Admitting: Cardiology

## 2014-12-15 ENCOUNTER — Telehealth: Payer: Self-pay | Admitting: *Deleted

## 2014-12-15 NOTE — Telephone Encounter (Signed)
Spoke to patient. Result given . Verbalized understanding  

## 2014-12-15 NOTE — Telephone Encounter (Signed)
Left message on  Cell voice mail - to call back

## 2014-12-15 NOTE — Telephone Encounter (Signed)
-----   Message from Marykay Lexavid W Harding, MD sent at 12/14/2014 11:55 PM EST ----- Excellent Cholesterol Panel!!  Marykay LexHARDING, DAVID W, MD

## 2015-02-06 ENCOUNTER — Other Ambulatory Visit: Payer: Self-pay | Admitting: Cardiology

## 2015-04-19 ENCOUNTER — Other Ambulatory Visit: Payer: Self-pay | Admitting: Family Medicine

## 2015-04-19 ENCOUNTER — Ambulatory Visit
Admission: RE | Admit: 2015-04-19 | Discharge: 2015-04-19 | Disposition: A | Discharge status: Home / Self Care | Payer: Managed Care, Other (non HMO) | Source: Ambulatory Visit | Attending: Family Medicine | Admitting: Family Medicine

## 2015-04-19 DIAGNOSIS — R059 Cough, unspecified: Secondary | ICD-10-CM

## 2015-04-19 DIAGNOSIS — R05 Cough: Secondary | ICD-10-CM

## 2018-03-28 ENCOUNTER — Emergency Department (HOSPITAL_COMMUNITY): Payer: Managed Care, Other (non HMO)

## 2018-03-28 ENCOUNTER — Encounter (HOSPITAL_COMMUNITY): Payer: Self-pay

## 2018-03-28 ENCOUNTER — Emergency Department (HOSPITAL_COMMUNITY)
Admission: EM | Admit: 2018-03-28 | Discharge: 2018-03-28 | Disposition: A | Discharge status: Home / Self Care | Payer: Managed Care, Other (non HMO) | Attending: Emergency Medicine | Admitting: Emergency Medicine

## 2018-03-28 DIAGNOSIS — Z951 Presence of aortocoronary bypass graft: Secondary | ICD-10-CM | POA: Diagnosis not present

## 2018-03-28 DIAGNOSIS — S61215A Laceration without foreign body of left ring finger without damage to nail, initial encounter: Secondary | ICD-10-CM | POA: Diagnosis not present

## 2018-03-28 DIAGNOSIS — S6982XA Other specified injuries of left wrist, hand and finger(s), initial encounter: Secondary | ICD-10-CM | POA: Diagnosis present

## 2018-03-28 DIAGNOSIS — F17210 Nicotine dependence, cigarettes, uncomplicated: Secondary | ICD-10-CM | POA: Diagnosis not present

## 2018-03-28 DIAGNOSIS — Z7982 Long term (current) use of aspirin: Secondary | ICD-10-CM | POA: Diagnosis not present

## 2018-03-28 DIAGNOSIS — W312XXA Contact with powered woodworking and forming machines, initial encounter: Secondary | ICD-10-CM | POA: Insufficient documentation

## 2018-03-28 DIAGNOSIS — Z79899 Other long term (current) drug therapy: Secondary | ICD-10-CM | POA: Diagnosis not present

## 2018-03-28 DIAGNOSIS — I10 Essential (primary) hypertension: Secondary | ICD-10-CM | POA: Insufficient documentation

## 2018-03-28 DIAGNOSIS — Y999 Unspecified external cause status: Secondary | ICD-10-CM | POA: Insufficient documentation

## 2018-03-28 DIAGNOSIS — S6992XA Unspecified injury of left wrist, hand and finger(s), initial encounter: Secondary | ICD-10-CM

## 2018-03-28 DIAGNOSIS — Y929 Unspecified place or not applicable: Secondary | ICD-10-CM | POA: Insufficient documentation

## 2018-03-28 DIAGNOSIS — Z23 Encounter for immunization: Secondary | ICD-10-CM | POA: Insufficient documentation

## 2018-03-28 DIAGNOSIS — I252 Old myocardial infarction: Secondary | ICD-10-CM | POA: Insufficient documentation

## 2018-03-28 DIAGNOSIS — I2510 Atherosclerotic heart disease of native coronary artery without angina pectoris: Secondary | ICD-10-CM | POA: Diagnosis not present

## 2018-03-28 DIAGNOSIS — Y9389 Activity, other specified: Secondary | ICD-10-CM | POA: Insufficient documentation

## 2018-03-28 MED ORDER — HYDROCODONE-ACETAMINOPHEN 5-325 MG PO TABS: 1.0000 | ORAL_TABLET | Freq: Four times a day (QID) | ORAL | 0 refills | Status: AC | PRN

## 2018-03-28 MED ORDER — BUPIVACAINE HCL (PF) 0.5 % IJ SOLN
30.0000 mL | Freq: Once | INTRAMUSCULAR | Status: DC
  Filled 2018-03-28: qty 30

## 2018-03-28 MED ORDER — LIDOCAINE HCL (PF) 1 % IJ SOLN
5.0000 mL | Freq: Once | INTRAMUSCULAR | Status: AC
Start: 2018-03-28 — End: 2018-03-28
  Administered 2018-03-28: 5 mL
  Filled 2018-03-28: qty 5

## 2018-03-28 MED ORDER — TETANUS-DIPHTH-ACELL PERTUSSIS 5-2.5-18.5 LF-MCG/0.5 IM SUSP
0.5000 mL | Freq: Once | INTRAMUSCULAR | Status: AC
  Administered 2018-03-28: 0.5 mL via INTRAMUSCULAR
  Filled 2018-03-28: qty 0.5

## 2018-03-28 MED ORDER — LIDOCAINE HCL (PF) 1 % IJ SOLN
5.0000 mL | Freq: Once | INTRAMUSCULAR | Status: AC
  Administered 2018-03-28: 5 mL
  Filled 2018-03-28: qty 5

## 2018-03-28 MED ORDER — SULFAMETHOXAZOLE-TRIMETHOPRIM 800-160 MG PO TABS: 1.0000 | ORAL_TABLET | Freq: Two times a day (BID) | ORAL | 0 refills | Status: AC

## 2018-03-28 NOTE — Discharge Instructions (Addendum)
You can take ibuprofen in addition to the medications we give you. Follow up with Dr. Merlyn LotKuzma as discussed.

## 2018-03-28 NOTE — ED Provider Notes (Signed)
[facility] EMERGENCY DEPARTMENT Provider Note   CSN: [id number] Arrival date & time: 03/28/18  1758     History   Chief Complaint No chief complaint on file.   HPI Jason Roberts is a 62 y.o. male who presents to the ED with laceration to the ring finger of the left hand after cutting it with a skill saw. Patient reports pain and bleeding to the tip of the finger. Patient denies any other injuries.   HPI  Past Medical History:  Diagnosis Date  . CAD in native artery 12/26/2013   Three-vessel disease with 100% occluded RCA (phyysioologically a STEMI), 90% OM 3, at present LAD. Extensive left right collaterals.  . 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  . Hemorrhoids   . History of empyema of pleura     status post partial lung resection  . History of tobacco abuse    Quit during this hospitalization for MI.  . Non-STEMI (non-ST elevated myocardial infarction) (HCC) 12/26/2013    Following 2 weeks of stuttering chest pain  . 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 (Dr. Laneta Simmers)    Patient Active Problem List   Diagnosis Date Noted  . Obesity (BMI 30-39.9) 11/28/2014  . Essential hypertension 04/24/2014  . Overweight (BMI 25.0-29.9) 01/22/2014  . S/P CABG x 3 12/29/2013  . Multivessel CAD - s/p CABG 12/28/2013  . Dyslipidemia, goal LDL below 70 12/27/2013  . Non-STEMI (non-ST elevated myocardial infarction) (HCC) 12/26/2013  . Tobacco abuse 12/26/2013  . Dextrocardia 12/26/2013  . Hemorrhoids 02/25/2013  . Insomnia 03/27/2012    Past Surgical History:  Procedure Laterality Date  . CARDIAC CATHETERIZATION  12/28/2013   Early mid LAD 80% at takeoff of D1. Prox OM3 90%, 100 occluded RCA  . CORONARY ARTERY BYPASS GRAFT N/A 12/29/2013   Procedure: CORONARY ARTERY BYPASS GRAFTING (CABG);  Surgeon: Alleen Borne, MD;  Location: [facility] OR;  Service: Open Heart Surgery;  Laterality: N/A;  Times three  using left  internal mammary artery and endoscopically harvested right saphenous vein  . INTRAOPERATIVE TRANSESOPHAGEAL ECHOCARDIOGRAM N/A 12/29/2013   Procedure: INTRAOPERATIVE TRANSESOPHAGEAL ECHOCARDIOGRAM;  Surgeon: Alleen Borne, MD;  Location: [facility] OR;  Service: Open Heart Surgery;  Laterality: N/A;  . LAMINECTOMY    . left arm chain saw injury    . LEFT HEART CATHETERIZATION WITH CORONARY ANGIOGRAM N/A 12/27/2013   Procedure: LEFT HEART CATHETERIZATION WITH CORONARY ANGIOGRAM;  Surgeon: Marykay Lex, MD;  Location: [facility] CATH LAB;  Service: Cardiovascular;  Laterality: N/A;  . LLL lung  1977   due to empyema  . TRANSTHORACIC ECHOCARDIOGRAM  12/27/2013   DDS. Poor sternal 1 doesn't do to right deviation. Normal LV size and function. Otherwise essentially normal with the exception of the angle the heart.        Home Medications    Prior to Admission medications   Medication Sig Start Date End Date Taking? Authorizing Provider  aspirin EC 325 MG EC tablet Take 1 tablet (325 mg total) by mouth daily. 01/03/14   Gold, Glenice Laine, PA-C  atorvastatin (LIPITOR) 80 MG tablet Take 1 tablet (80 mg total) by mouth daily at 6 PM. 01/03/14   Gold, Glenice Laine, PA-C  HYDROcodone-acetaminophen (NORCO) 5-325 MG tablet Take 1 tablet by mouth every 6 (six) hours as needed. 03/28/18   Janne Napoleon, NP  ibuprofen (ADVIL,MOTRIN) 200 MG tablet Take 200 mg by mouth every  6 (six) hours as needed for mild pain.    [provider]  levothyroxine (SYNTHROID, LEVOTHROID) 50 MCG tablet Take 50 mcg by mouth daily before breakfast.    [provider]  levothyroxine (SYNTHROID, LEVOTHROID) 75 MCG tablet  10/17/14   [provider]  lisinopril (PRINIVIL,ZESTRIL) 5 MG tablet TAKE 1 TABLET EVERY DAY 02/06/15   Marykay Lex, MD  metoprolol tartrate (LOPRESSOR) 25 MG tablet Take 1 tablet (25 mg total) by mouth 2 (two) times daily. 01/03/14   Gold, Wayne E, PA-C  sulfamethoxazole-trimethoprim (BACTRIM DS,SEPTRA DS)  800-160 MG tablet Take 1 tablet by mouth 2 (two) times daily for 7 days. 03/28/18 04/04/18  Janne Napoleon, NP  traZODone (DESYREL) 50 MG tablet Take 150 mg by mouth at bedtime.    [provider]  triamcinolone cream (KENALOG) 0.5 %  02/02/14   [provider]    Family History Family History  Problem Relation Age of Onset  . Diabetes Mother   . Hypertension Mother   . Stroke Father     Social History Social History   Tobacco Use  . Smoking status: Current Every Day Smoker    Packs/day: 0.30    Types: Cigarettes    Last attempt to quit: 12/26/2013    Years since quitting: 4.2  . Smokeless tobacco: Never Used  . Tobacco comment: restart smoking about 3 a day  Substance Use Topics  . Alcohol use: No  . Drug use: No     Allergies   Penicillins   Review of Systems Review of Systems  Musculoskeletal: Positive for arthralgias.  Skin: Positive for wound.  All other systems reviewed and are negative.    Physical Exam Updated Vital Signs BP 104/63   Pulse 67   Temp 98 F (36.7 C) (Oral)   Resp 18   SpO2 96%   Physical Exam  Constitutional: He appears well-developed and well-nourished. No distress.  HENT:  Head: Normocephalic.  Eyes: EOM are normal.  Neck: Neck supple.  Cardiovascular: Normal rate.  Pulmonary/Chest: Effort normal.  Musculoskeletal: Normal range of motion.       Left hand: He exhibits laceration and swelling. Normal sensation noted. Normal strength noted. He exhibits no thumb/finger opposition.       Hands: Palmar aspect of the left ring finger, distal aspect with irregular wound.   Neurological: He is alert.  Skin: Skin is warm and dry.  Wound left ring finger  Psychiatric: He has a normal mood and affect.  Nursing note and vitals reviewed.    ED Treatments / Results  Labs (all labs ordered are listed, but only abnormal results are displayed) Labs Reviewed - No data to display  EKG None  Radiology Dg Finger Ring  Left  Result Date: 03/28/2018 CLINICAL DATA:  Left fourth finger laceration. EXAM: LEFT RING FINGER 2+V COMPARISON:  None. FINDINGS: There is no evidence of fracture or dislocation. There is no evidence of arthropathy or other focal bone abnormality. Laceration is seen involving the distal soft tissues. No definite radiopaque foreign body is noted. IMPRESSION: No fracture or dislocation is noted. Electronically Signed   By: Lupita Raider, M.D.   On: 03/28/2018 19:17    Procedures .Nerve Block Date/Time: 03/28/2018 9:48 PM Performed by: Janne Napoleon, NP Authorized by: Janne Napoleon, NP   Consent:    Consent obtained:  Verbal   Consent given by:  Patient   Risks discussed:  Swelling and pain Indications:  Indications:  Pain relief and procedural anesthesia Location:    Body area:  Upper extremity   Laterality:  Left Pre-procedure details:    Skin preparation:  2% chlorhexidine   Preparation: Patient was prepped and draped in usual sterile fashion   Skin anesthesia (see MAR for exact dosages):    Skin anesthesia method:  None Procedure details (see MAR for exact dosages):    Block needle gauge:  25 G   Anesthetic injected:  Lidocaine 1% w/o epi   Steroid injected:  None   Additive injected:  None   Injection procedure:  Anatomic landmarks identified, incremental injection and negative aspiration for blood Post-procedure details:    Outcome:  Pain relieved   Patient tolerance of procedure:  Tolerated well, no immediate complications   (including critical care time)  Medications Ordered in ED Medications  Tdap (BOOSTRIX) injection 0.5 mL (has no administration in time range)  bupivacaine (MARCAINE) 0.5 % injection 30 mL (has no administration in time range)  lidocaine (PF) (XYLOCAINE) 1 % injection 5 mL (5 mLs Infiltration Given by Other 03/28/18 2019)  lidocaine (PF) (XYLOCAINE) 1 % injection 5 mL (5 mLs Infiltration Given 03/28/18 2322)     Initial Impression / Assessment  and Plan / ED Course  I have reviewed the triage vital signs and the nursing notes. 62 y.o. male with wound to the left ring finger due to table saw. Stable for d/c after Dr. Merlyn LotKuzma came in and repaired the wound. Antibiotics and pain medication and f/u in the office. Patient agrees with plan.   Final Clinical Impressions(s) / ED Diagnoses   Final diagnoses:  Finger injury, left, initial encounter    ED Discharge Orders        Ordered    sulfamethoxazole-trimethoprim (BACTRIM DS,SEPTRA DS) 800-160 MG tablet  2 times daily     03/28/18 2338    HYDROcodone-acetaminophen (NORCO) 5-325 MG tablet  Every 6 hours PRN     03/28/18 2338       Kerrie Buffaloeese, Hope WiltonM, NP 03/28/18 2346    Shaune PollackIsaacs, Cameron, MD 03/29/18 1122

## 2018-03-28 NOTE — Consult Note (Addendum)
Jason Roberts is an 62 y.o. male.   Chief Complaint: Left ring finger laceration HPI: 62 year old male states he was using a circular saw earlier today when he sustained a laceration to the left ring finger.  He reports the pain was aggravated with motion and alleviated by rest.  He has had a digital block which is also alleviated the pain.  There is associated bleeding with the wound.  Case discussed with Kerrie Buffalo, NP and her note from 03/28/2018 reviewed. Xrays viewed and interpreted by me: 3 views left ring finger show no fracture dislocation or radiopaque foreign body. Labs reviewed: None  Allergies:  Allergies  Allergen Reactions  . Penicillins Swelling    Past Medical History:  Diagnosis Date  . CAD in native artery 12/26/2013   Three-vessel disease with 100% occluded RCA (phyysioologically a STEMI), 90% OM 3, at present LAD. Extensive left right collaterals.  . 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  . Hemorrhoids   . History of empyema of pleura     status post partial lung resection  . History of tobacco abuse    Quit during this hospitalization for MI.  . Non-STEMI (non-ST elevated myocardial infarction) (HCC) 12/26/2013    Following 2 weeks of stuttering chest pain  . 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 (Dr. Laneta Simmers)    Past Surgical History:  Procedure Laterality Date  . CARDIAC CATHETERIZATION  12/28/2013   Early mid LAD 80% at takeoff of D1. Prox OM3 90%, 100 occluded RCA  . CORONARY ARTERY BYPASS GRAFT N/A 12/29/2013   Procedure: CORONARY ARTERY BYPASS GRAFTING (CABG);  Surgeon: Alleen Borne, MD;  Location: [facility] OR;  Service: Open Heart Surgery;  Laterality: N/A;  Times three  using left internal mammary artery and endoscopically harvested right saphenous vein  . INTRAOPERATIVE TRANSESOPHAGEAL ECHOCARDIOGRAM N/A 12/29/2013   Procedure: INTRAOPERATIVE TRANSESOPHAGEAL ECHOCARDIOGRAM;  Surgeon: Alleen Borne, MD;   Location: [facility] OR;  Service: Open Heart Surgery;  Laterality: N/A;  . LAMINECTOMY    . left arm chain saw injury    . LEFT HEART CATHETERIZATION WITH CORONARY ANGIOGRAM N/A 12/27/2013   Procedure: LEFT HEART CATHETERIZATION WITH CORONARY ANGIOGRAM;  Surgeon: Marykay Lex, MD;  Location: [facility] CATH LAB;  Service: Cardiovascular;  Laterality: N/A;  . LLL lung  1977   due to empyema  . TRANSTHORACIC ECHOCARDIOGRAM  12/27/2013   DDS. Poor sternal 1 doesn't do to right deviation. Normal LV size and function. Otherwise essentially normal with the exception of the angle the heart.    Family History: Family History  Problem Relation Age of Onset  . Diabetes Mother   . Hypertension Mother   . Stroke Father     Social History:   reports that he has been smoking cigarettes.  He has been smoking about 0.30 packs per day. He has never used smokeless tobacco. He reports that he does not drink alcohol or use drugs.  Medications:  (Not in a hospital admission)  No results found for this or any previous visit (from the past 48 hour(s)).  Dg Finger Ring Left  Result Date: 03/28/2018 CLINICAL DATA:  Left fourth finger laceration. EXAM: LEFT RING FINGER 2+V COMPARISON:  None. FINDINGS: There is no evidence of fracture or dislocation. There is no evidence of arthropathy or other focal bone abnormality. Laceration is seen involving the distal soft tissues. No definite radiopaque foreign body is noted. IMPRESSION: No fracture  or dislocation is noted. Electronically Signed   By: Lupita RaiderJames  Green Jr, M.D.   On: 03/28/2018 19:17     A comprehensive review of systems was negative. Review of Systems: No fevers, chills, night sweats, chest pain, shortness of breath, nausea, vomiting, diarrhea, constipation, easy bleeding or bruising, headaches, dizziness, vision changes, fainting.   Blood pressure 104/63, pulse 67, temperature 98 F (36.7 C), temperature source Oral, resp. rate 18, SpO2 96 %.  General  appearance: alert, cooperative and appears stated age Head: Normocephalic, without obvious abnormality, atraumatic Neck: supple, symmetrical, trachea midline Extremities: Intact sensation and capillary refill all digits.  +epl/fpl/io.  In the left ring finger there is a wound in the distal aspect of the pad of the finger going from distally obliquely to proximally.  There is some loss of skin.  Subcu tissues present.  No gross contamination noted.  He has intact sensation (decreased from digital block) and capillary refill on both sides of the wound. Pulses: 2+ and symmetric Skin: Skin color, texture, turgor normal. No rashes or lesions Neurologic: Grossly normal Incision/Wound: As above  Assessment/Plan Left ring finger laceration.  Recommended irrigation of the laceration and closure in the emergency department.  He has already had a digital block.  He agrees with plan of care.  Procedure note: Digital block had been previously performed.  The wound is copiously irrigated with sterile saline.  He has had sensitivity to topical Betadine.  The finger was prepped with ChloraPrep.  Was sterilely draped.  A Penrose drain was used as a tourniquet was up for approximately 10 minutes.  The wound was cleaned.  It was approximately 2 cm in length.  No gross contamination was noted.  The wound was then reapproximated with 5-0 Monocryl suture in an interrupted fashion.  Some areas had loss of the epidermis.  These were brought back together so that there was contact of the subcutaneous tissues but not necessarily contact of the epidermis to allow good contour of the finger.  The wound was then dressed with sterile Xeroform 4 x 4 and wrapped lightly with a Coban dressing.  The Penrose drain was removed.  Tolerated procedure well.  I will see him back in the office in approximate 1 week for postprocedural follow-up.  Pain meds and antibiotics per the emergency department.  Inman Fettig R 03/28/2018, 11:43  PM

## 2018-03-28 NOTE — ED Triage Notes (Signed)
Patient here with laceration to left hand ring finger after cutting same on skill saw. No bleeding on arrival. Full rom

## 2018-03-28 NOTE — ED Triage Notes (Signed)
Called x 2 NO answer 

## 2018-03-28 NOTE — ED Notes (Signed)
Dr Kuzma at bedside. 

## 2019-07-12 ENCOUNTER — Other Ambulatory Visit: Payer: Self-pay | Admitting: Physical Medicine and Rehabilitation

## 2019-07-12 ENCOUNTER — Other Ambulatory Visit: Payer: Self-pay

## 2019-07-12 ENCOUNTER — Ambulatory Visit
Admission: RE | Admit: 2019-07-12 | Discharge: 2019-07-12 | Disposition: A | Discharge status: Home / Self Care | Payer: 59 | Source: Ambulatory Visit | Attending: Physical Medicine and Rehabilitation | Admitting: Physical Medicine and Rehabilitation

## 2019-07-12 DIAGNOSIS — M549 Dorsalgia, unspecified: Secondary | ICD-10-CM

## 2020-12-15 ENCOUNTER — Other Ambulatory Visit (HOSPITAL_COMMUNITY): Payer: Self-pay | Admitting: Family

## 2020-12-15 DIAGNOSIS — U071 COVID-19: Secondary | ICD-10-CM

## 2020-12-15 NOTE — Progress Notes (Signed)
I connected by phone with Jason Roberts on 12/15/2020 at 6:48 PM to discuss the potential use of a new treatment for mild to moderate COVID-19 viral infection in non-hospitalized patients.  This patient is a 64 y.o. male that meets the FDA criteria for Emergency Use Authorization of COVID monoclonal antibody casirivimab/imdevimab, bamlanivimab/etesevimab, or sotrovimab.  Has a (+) direct SARS-CoV-2 viral test result  Has mild or moderate COVID-19   Is NOT hospitalized due to COVID-19  Is within 10 days of symptom onset  Has at least one of the high risk factor(s) for progression to severe COVID-19 and/or hospitalization as defined in EUA.  Specific high risk criteria : Diabetes and Cardiovascular disease or hypertension   Symptoms of weakness, congestion, cough, and SOB began 12/11/20.   I have spoken and communicated the following to the patient or parent/caregiver regarding COVID monoclonal antibody treatment:  1. FDA has authorized the emergency use for the treatment of mild to moderate COVID-19 in adults and pediatric patients with positive results of direct SARS-CoV-2 viral testing who are 42 years of age and older weighing at least 40 kg, and who are at high risk for progressing to severe COVID-19 and/or hospitalization.  2. The significant known and potential risks and benefits of COVID monoclonal antibody, and the extent to which such potential risks and benefits are unknown.  3. Information on available alternative treatments and the risks and benefits of those alternatives, including clinical trials.  4. Patients treated with COVID monoclonal antibody should continue to self-isolate and use infection control measures (e.g., wear mask, isolate, social distance, avoid sharing personal items, clean and disinfect "high touch" surfaces, and frequent handwashing) according to CDC guidelines.   5. The patient or parent/caregiver has the option to accept or refuse COVID monoclonal  antibody treatment.  After reviewing this information with the patient, the patient has agreed to receive one of the available covid 19 monoclonal antibodies and will be provided an appropriate fact sheet prior to infusion. Jason Stall, NP 12/15/2020 6:48 PM

## 2020-12-16 ENCOUNTER — Ambulatory Visit (HOSPITAL_COMMUNITY)
Admission: RE | Admit: 2020-12-16 | Discharge: 2020-12-16 | Disposition: A | Discharge status: Home / Self Care | Payer: 59 | Source: Ambulatory Visit | Attending: Pulmonary Disease | Admitting: Pulmonary Disease

## 2020-12-16 DIAGNOSIS — U071 COVID-19: Secondary | ICD-10-CM

## 2020-12-16 MED ORDER — DIPHENHYDRAMINE HCL 50 MG/ML IJ SOLN: 50.0000 mg | Freq: Once | INTRAMUSCULAR | Status: DC | PRN

## 2020-12-16 MED ORDER — METHYLPREDNISOLONE SODIUM SUCC 125 MG IJ SOLR: 125.0000 mg | Freq: Once | INTRAMUSCULAR | Status: DC | PRN

## 2020-12-16 MED ORDER — SODIUM CHLORIDE 0.9 % IV SOLN: INTRAVENOUS | Status: DC | PRN

## 2020-12-16 MED ORDER — EPINEPHRINE 0.3 MG/0.3ML IJ SOAJ: 0.3000 mg | Freq: Once | INTRAMUSCULAR | Status: DC | PRN

## 2020-12-16 MED ORDER — ALBUTEROL SULFATE HFA 108 (90 BASE) MCG/ACT IN AERS: 2.0000 | INHALATION_SPRAY | Freq: Once | RESPIRATORY_TRACT | Status: DC | PRN

## 2020-12-16 MED ORDER — FAMOTIDINE IN NACL 20-0.9 MG/50ML-% IV SOLN: 20.0000 mg | Freq: Once | INTRAVENOUS | Status: DC | PRN

## 2020-12-16 MED ORDER — SODIUM CHLORIDE 0.9 % IV SOLN: Freq: Once | INTRAVENOUS | Status: AC

## 2020-12-16 NOTE — Progress Notes (Signed)
  Diagnosis: COVID-19  Physician: Dr. Wright  Procedure: Cov[facility] Infusion Clinic Med: bamlanivimab\etesevimab infusion - Provided patient with bamlanimivab\etesevimab fact sheet for patients, parents and caregivers prior to infusion.  Complications: No immediate complications noted.  Discharge: Discharged home   Camera Krienke Richards 12/16/2020   

## 2020-12-16 NOTE — Progress Notes (Signed)
Patient reviewed Fact Sheet for Patients, Parents, and Caregivers for Emergency Use Authorization (EUA) of bamlanivimab and etesevimab for the Treatment of Coronavirus. Patient also reviewed and is agreeable to the estimated cost of treatment. Patient is agreeable to proceed.   

## 2020-12-16 NOTE — Discharge Instructions (Signed)
10 Things You Can Do to Manage Your COVID-19 Symptoms at Home If you have possible or confirmed COVID-19: 1. Stay home from work and school. And stay away from other public places. If you must go out, avoid using any kind of public transportation, ridesharing, or taxis. 2. Monitor your symptoms carefully. If your symptoms get worse, call your healthcare provider immediately. 3. Get rest and stay hydrated. 4. If you have a medical appointment, call the healthcare provider ahead of time and tell them that you have or may have COVID-19. 5. For medical emergencies, call 911 and notify the dispatch personnel that you have or may have COVID-19. 6. Cover your cough and sneezes with a tissue or use the inside of your elbow. 7. Wash your hands often with soap and water for at least 20 seconds or clean your hands with an alcohol-based hand sanitizer that contains at least 60% alcohol. 8. As much as possible, stay in a specific room and away from other people in your home. Also, you should use a separate bathroom, if available. If you need to be around other people in or outside of the home, wear a mask. 9. Avoid sharing personal items with other people in your household, like dishes, towels, and bedding. 10. Clean all surfaces that are touched often, like counters, tabletops, and doorknobs. Use household cleaning sprays or wipes according to the label instructions. cdc.gov/coronavirus 06/30/2019 This information is not intended to replace advice given to you by your health care provider. Make sure you discuss any questions you have with your health care provider. Document Revised: 12/02/2019 Document Reviewed: 12/02/2019 Elsevier Patient Education  2020 Elsevier Inc. What types of side effects do monoclonal antibody drugs cause?  Common side effects  In general, the more common side effects caused by monoclonal antibody drugs include: . Allergic reactions, such as hives or itching . Flu-like signs and  symptoms, including chills, fatigue, fever, and muscle aches and pains . Nausea, vomiting . Diarrhea . Skin rashes . Low blood pressure   The CDC is recommending patients who receive monoclonal antibody treatments wait at least 90 days before being vaccinated.  Currently, there are no data on the safety and efficacy of mRNA COVID-19 vaccines in persons who received monoclonal antibodies or convalescent plasma as part of COVID-19 treatment. Based on the estimated half-life of such therapies as well as evidence suggesting that reinfection is uncommon in the 90 days after initial infection, vaccination should be deferred for at least 90 days, as a precautionary measure until additional information becomes available, to avoid interference of the antibody treatment with vaccine-induced immune responses. If you have any questions or concerns after the infusion please call the Advanced Practice Provider on call at [phone]. This number is ONLY intended for your use regarding questions or concerns about the infusion post-treatment side-effects.  Please do not provide this number to others for use. For return to work notes please contact your primary care provider.   If someone you know is interested in receiving treatment please have them call the COVID hotline at [phone].   

## 2021-04-11 DIAGNOSIS — M25622 Stiffness of left elbow, not elsewhere classified: Secondary | ICD-10-CM | POA: Diagnosis not present

## 2021-06-28 DIAGNOSIS — M25521 Pain in right elbow: Secondary | ICD-10-CM | POA: Diagnosis not present

## 2021-06-28 DIAGNOSIS — G5602 Carpal tunnel syndrome, left upper limb: Secondary | ICD-10-CM | POA: Diagnosis not present

## 2021-06-28 DIAGNOSIS — G5622 Lesion of ulnar nerve, left upper limb: Secondary | ICD-10-CM | POA: Diagnosis not present

## 2021-07-19 DIAGNOSIS — M25521 Pain in right elbow: Secondary | ICD-10-CM | POA: Diagnosis not present

## 2021-07-24 DIAGNOSIS — G5622 Lesion of ulnar nerve, left upper limb: Secondary | ICD-10-CM | POA: Diagnosis not present

## 2021-07-24 DIAGNOSIS — M25521 Pain in right elbow: Secondary | ICD-10-CM | POA: Diagnosis not present

## 2021-07-24 DIAGNOSIS — S46291D Other injury of muscle, fascia and tendon of other parts of biceps, right arm, subsequent encounter: Secondary | ICD-10-CM | POA: Diagnosis not present

## 2021-08-06 DIAGNOSIS — S46291A Other injury of muscle, fascia and tendon of other parts of biceps, right arm, initial encounter: Secondary | ICD-10-CM | POA: Diagnosis not present

## 2021-08-06 DIAGNOSIS — G8918 Other acute postprocedural pain: Secondary | ICD-10-CM | POA: Diagnosis not present

## 2021-08-06 DIAGNOSIS — S46211A Strain of muscle, fascia and tendon of other parts of biceps, right arm, initial encounter: Secondary | ICD-10-CM | POA: Diagnosis not present

## 2021-08-21 DIAGNOSIS — G5622 Lesion of ulnar nerve, left upper limb: Secondary | ICD-10-CM | POA: Diagnosis not present

## 2021-08-21 DIAGNOSIS — Z4789 Encounter for other orthopedic aftercare: Secondary | ICD-10-CM | POA: Diagnosis not present

## 2021-08-21 DIAGNOSIS — S46291D Other injury of muscle, fascia and tendon of other parts of biceps, right arm, subsequent encounter: Secondary | ICD-10-CM | POA: Diagnosis not present

## 2021-08-21 DIAGNOSIS — M25621 Stiffness of right elbow, not elsewhere classified: Secondary | ICD-10-CM | POA: Diagnosis not present

## 2021-09-04 DIAGNOSIS — M25621 Stiffness of right elbow, not elsewhere classified: Secondary | ICD-10-CM | POA: Diagnosis not present

## 2021-09-18 DIAGNOSIS — M25621 Stiffness of right elbow, not elsewhere classified: Secondary | ICD-10-CM | POA: Diagnosis not present

## 2022-01-31 DIAGNOSIS — Z1159 Encounter for screening for other viral diseases: Secondary | ICD-10-CM | POA: Diagnosis not present

## 2022-01-31 DIAGNOSIS — I2510 Atherosclerotic heart disease of native coronary artery without angina pectoris: Secondary | ICD-10-CM | POA: Diagnosis not present

## 2022-01-31 DIAGNOSIS — Z0000 Encounter for general adult medical examination without abnormal findings: Secondary | ICD-10-CM | POA: Diagnosis not present

## 2022-01-31 DIAGNOSIS — R7309 Other abnormal glucose: Secondary | ICD-10-CM | POA: Diagnosis not present

## 2022-01-31 DIAGNOSIS — Z125 Encounter for screening for malignant neoplasm of prostate: Secondary | ICD-10-CM | POA: Diagnosis not present

## 2022-01-31 DIAGNOSIS — E871 Hypo-osmolality and hyponatremia: Secondary | ICD-10-CM | POA: Diagnosis not present

## 2022-01-31 DIAGNOSIS — E7800 Pure hypercholesterolemia, unspecified: Secondary | ICD-10-CM | POA: Diagnosis not present

## 2022-01-31 DIAGNOSIS — F17200 Nicotine dependence, unspecified, uncomplicated: Secondary | ICD-10-CM | POA: Diagnosis not present

## 2022-01-31 DIAGNOSIS — Z1211 Encounter for screening for malignant neoplasm of colon: Secondary | ICD-10-CM | POA: Diagnosis not present

## 2022-01-31 DIAGNOSIS — E039 Hypothyroidism, unspecified: Secondary | ICD-10-CM | POA: Diagnosis not present

## 2022-03-14 DIAGNOSIS — M25531 Pain in right wrist: Secondary | ICD-10-CM | POA: Diagnosis not present

## 2022-03-22 DIAGNOSIS — M67833 Other specified disorders of tendon, right wrist: Secondary | ICD-10-CM | POA: Diagnosis not present

## 2022-03-22 DIAGNOSIS — M25531 Pain in right wrist: Secondary | ICD-10-CM | POA: Diagnosis not present

## 2022-04-04 DIAGNOSIS — R0981 Nasal congestion: Secondary | ICD-10-CM | POA: Diagnosis not present

## 2022-04-08 ENCOUNTER — Other Ambulatory Visit: Payer: Self-pay | Admitting: Family Medicine

## 2022-04-08 DIAGNOSIS — R0981 Nasal congestion: Secondary | ICD-10-CM

## 2022-04-23 DIAGNOSIS — M67833 Other specified disorders of tendon, right wrist: Secondary | ICD-10-CM | POA: Diagnosis not present

## 2022-04-23 DIAGNOSIS — M25531 Pain in right wrist: Secondary | ICD-10-CM | POA: Diagnosis not present

## 2022-04-30 ENCOUNTER — Other Ambulatory Visit: Payer: 59

## 2022-05-03 ENCOUNTER — Other Ambulatory Visit: Payer: Self-pay | Admitting: Physician Assistant

## 2022-05-03 ENCOUNTER — Ambulatory Visit
Admission: RE | Admit: 2022-05-03 | Discharge: 2022-05-03 | Disposition: A | Discharge status: Home / Self Care | Payer: Medicare HMO | Source: Ambulatory Visit | Attending: Physician Assistant | Admitting: Physician Assistant

## 2022-05-03 DIAGNOSIS — R0602 Shortness of breath: Secondary | ICD-10-CM | POA: Diagnosis not present

## 2022-05-03 DIAGNOSIS — R059 Cough, unspecified: Secondary | ICD-10-CM | POA: Diagnosis not present

## 2022-05-03 DIAGNOSIS — Z03818 Encounter for observation for suspected exposure to other biological agents ruled out: Secondary | ICD-10-CM | POA: Diagnosis not present

## 2022-05-03 DIAGNOSIS — F17200 Nicotine dependence, unspecified, uncomplicated: Secondary | ICD-10-CM | POA: Diagnosis not present

## 2022-06-04 DIAGNOSIS — M25531 Pain in right wrist: Secondary | ICD-10-CM | POA: Diagnosis not present

## 2022-06-04 DIAGNOSIS — M67833 Other specified disorders of tendon, right wrist: Secondary | ICD-10-CM | POA: Diagnosis not present

## 2022-07-04 DIAGNOSIS — M25531 Pain in right wrist: Secondary | ICD-10-CM | POA: Diagnosis not present

## 2022-09-24 DIAGNOSIS — H524 Presbyopia: Secondary | ICD-10-CM | POA: Diagnosis not present

## 2022-09-24 DIAGNOSIS — H2513 Age-related nuclear cataract, bilateral: Secondary | ICD-10-CM | POA: Diagnosis not present

## 2022-12-09 DIAGNOSIS — Z1211 Encounter for screening for malignant neoplasm of colon: Secondary | ICD-10-CM | POA: Diagnosis not present

## 2022-12-09 DIAGNOSIS — K219 Gastro-esophageal reflux disease without esophagitis: Secondary | ICD-10-CM | POA: Diagnosis not present

## 2022-12-09 DIAGNOSIS — K439 Ventral hernia without obstruction or gangrene: Secondary | ICD-10-CM | POA: Diagnosis not present

## 2023-02-04 DIAGNOSIS — K219 Gastro-esophageal reflux disease without esophagitis: Secondary | ICD-10-CM | POA: Diagnosis not present

## 2023-02-04 DIAGNOSIS — E7800 Pure hypercholesterolemia, unspecified: Secondary | ICD-10-CM | POA: Diagnosis not present

## 2023-02-04 DIAGNOSIS — I2510 Atherosclerotic heart disease of native coronary artery without angina pectoris: Secondary | ICD-10-CM | POA: Diagnosis not present

## 2023-02-04 DIAGNOSIS — M549 Dorsalgia, unspecified: Secondary | ICD-10-CM | POA: Diagnosis not present

## 2023-02-04 DIAGNOSIS — F17200 Nicotine dependence, unspecified, uncomplicated: Secondary | ICD-10-CM | POA: Diagnosis not present

## 2023-02-04 DIAGNOSIS — E039 Hypothyroidism, unspecified: Secondary | ICD-10-CM | POA: Diagnosis not present

## 2023-02-04 DIAGNOSIS — R7309 Other abnormal glucose: Secondary | ICD-10-CM | POA: Diagnosis not present

## 2023-02-04 DIAGNOSIS — Z0000 Encounter for general adult medical examination without abnormal findings: Secondary | ICD-10-CM | POA: Diagnosis not present

## 2023-02-04 DIAGNOSIS — E871 Hypo-osmolality and hyponatremia: Secondary | ICD-10-CM | POA: Diagnosis not present

## 2023-02-07 DIAGNOSIS — Z1211 Encounter for screening for malignant neoplasm of colon: Secondary | ICD-10-CM | POA: Diagnosis not present

## 2023-02-07 DIAGNOSIS — K5730 Diverticulosis of large intestine without perforation or abscess without bleeding: Secondary | ICD-10-CM | POA: Diagnosis not present

## 2023-02-07 DIAGNOSIS — D123 Benign neoplasm of transverse colon: Secondary | ICD-10-CM | POA: Diagnosis not present

## 2023-02-11 DIAGNOSIS — D123 Benign neoplasm of transverse colon: Secondary | ICD-10-CM | POA: Diagnosis not present

## 2023-03-04 DIAGNOSIS — M67833 Other specified disorders of tendon, right wrist: Secondary | ICD-10-CM | POA: Diagnosis not present

## 2023-03-04 DIAGNOSIS — M25531 Pain in right wrist: Secondary | ICD-10-CM | POA: Diagnosis not present

## 2023-03-17 DIAGNOSIS — M19031 Primary osteoarthritis, right wrist: Secondary | ICD-10-CM | POA: Diagnosis not present

## 2023-03-17 DIAGNOSIS — M25531 Pain in right wrist: Secondary | ICD-10-CM | POA: Diagnosis not present

## 2023-03-17 DIAGNOSIS — M79601 Pain in right arm: Secondary | ICD-10-CM | POA: Diagnosis not present

## 2023-04-01 DIAGNOSIS — M25531 Pain in right wrist: Secondary | ICD-10-CM | POA: Diagnosis not present

## 2023-04-29 DIAGNOSIS — M25531 Pain in right wrist: Secondary | ICD-10-CM | POA: Diagnosis not present

## 2023-05-08 DIAGNOSIS — M25631 Stiffness of right wrist, not elsewhere classified: Secondary | ICD-10-CM | POA: Diagnosis not present

## 2023-05-15 DIAGNOSIS — M25631 Stiffness of right wrist, not elsewhere classified: Secondary | ICD-10-CM | POA: Diagnosis not present

## 2023-05-27 DIAGNOSIS — L578 Other skin changes due to chronic exposure to nonionizing radiation: Secondary | ICD-10-CM | POA: Diagnosis not present

## 2023-05-27 DIAGNOSIS — M25631 Stiffness of right wrist, not elsewhere classified: Secondary | ICD-10-CM | POA: Diagnosis not present

## 2023-05-27 DIAGNOSIS — D2262 Melanocytic nevi of left upper limb, including shoulder: Secondary | ICD-10-CM | POA: Diagnosis not present

## 2023-05-27 DIAGNOSIS — L570 Actinic keratosis: Secondary | ICD-10-CM | POA: Diagnosis not present

## 2023-05-27 DIAGNOSIS — D225 Melanocytic nevi of trunk: Secondary | ICD-10-CM | POA: Diagnosis not present

## 2023-05-27 DIAGNOSIS — D2261 Melanocytic nevi of right upper limb, including shoulder: Secondary | ICD-10-CM | POA: Diagnosis not present

## 2023-05-27 DIAGNOSIS — L821 Other seborrheic keratosis: Secondary | ICD-10-CM | POA: Diagnosis not present

## 2023-05-29 DIAGNOSIS — M25531 Pain in right wrist: Secondary | ICD-10-CM | POA: Diagnosis not present

## 2023-07-29 DIAGNOSIS — M25531 Pain in right wrist: Secondary | ICD-10-CM | POA: Diagnosis not present

## 2023-09-15 DIAGNOSIS — H524 Presbyopia: Secondary | ICD-10-CM | POA: Diagnosis not present

## 2023-09-15 DIAGNOSIS — Z0100 Encounter for examination of eyes and vision without abnormal findings: Secondary | ICD-10-CM | POA: Diagnosis not present

## 2023-09-26 DIAGNOSIS — M25531 Pain in right wrist: Secondary | ICD-10-CM | POA: Diagnosis not present

## 2023-10-20 ENCOUNTER — Other Ambulatory Visit: Payer: Self-pay | Admitting: Family Medicine

## 2023-10-20 DIAGNOSIS — Z139 Encounter for screening, unspecified: Secondary | ICD-10-CM | POA: Diagnosis not present

## 2023-10-20 DIAGNOSIS — R053 Chronic cough: Secondary | ICD-10-CM | POA: Diagnosis not present

## 2023-10-20 DIAGNOSIS — Z1389 Encounter for screening for other disorder: Secondary | ICD-10-CM | POA: Diagnosis not present

## 2023-10-20 DIAGNOSIS — F17200 Nicotine dependence, unspecified, uncomplicated: Secondary | ICD-10-CM | POA: Diagnosis not present

## 2023-10-31 ENCOUNTER — Ambulatory Visit
Admission: RE | Admit: 2023-10-31 | Discharge: 2023-10-31 | Disposition: A | Discharge status: Home / Self Care | Payer: Medicare HMO | Source: Ambulatory Visit | Attending: Family Medicine | Admitting: Family Medicine

## 2023-10-31 DIAGNOSIS — Z902 Acquired absence of lung [part of]: Secondary | ICD-10-CM | POA: Diagnosis not present

## 2023-10-31 DIAGNOSIS — I700 Atherosclerosis of aorta: Secondary | ICD-10-CM | POA: Diagnosis not present

## 2023-10-31 DIAGNOSIS — R053 Chronic cough: Secondary | ICD-10-CM

## 2023-10-31 DIAGNOSIS — J432 Centrilobular emphysema: Secondary | ICD-10-CM | POA: Diagnosis not present

## 2023-10-31 MED ORDER — IOPAMIDOL (ISOVUE-300) INJECTION 61%
200.0000 mL | Freq: Once | INTRAVENOUS | Status: AC | PRN
  Administered 2023-10-31: 75 mL via INTRAVENOUS

## 2024-11-11 DIAGNOSIS — Z23 Encounter for immunization: Secondary | ICD-10-CM | POA: Diagnosis not present
